# Patient Record
Sex: Female | Born: 1995 | Race: White | Hispanic: No | Marital: Single | Smoking: Never smoker
Health system: Southern US, Community
[De-identification: ages and names within clinical notes are randomized; demographics above are authoritative.]

## PROBLEM LIST (undated history)

## (undated) ENCOUNTER — Inpatient Hospital Stay (HOSPITAL_COMMUNITY): Payer: Self-pay

## (undated) DIAGNOSIS — O99019 Anemia complicating pregnancy, unspecified trimester: Secondary | ICD-10-CM

## (undated) DIAGNOSIS — N764 Abscess of vulva: Secondary | ICD-10-CM

## (undated) DIAGNOSIS — Z789 Other specified health status: Secondary | ICD-10-CM

## (undated) HISTORY — DX: Anemia complicating pregnancy, unspecified trimester: O99.019

## (undated) HISTORY — PX: APPENDECTOMY: SHX54

## (undated) HISTORY — PX: WISDOM TOOTH EXTRACTION: SHX21

## (undated) HISTORY — DX: Abscess of vulva: N76.4

---
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
## 2015-05-16 ENCOUNTER — Encounter: Payer: Self-pay | Admitting: Emergency Medicine

## 2015-05-16 ENCOUNTER — Emergency Department (INDEPENDENT_AMBULATORY_CARE_PROVIDER_SITE_OTHER)
Admission: EM | Admit: 2015-05-16 | Discharge: 2015-05-16 | Disposition: A | Discharge status: Home / Self Care | Payer: BC Managed Care – PPO | Source: Home / Self Care | Attending: Emergency Medicine | Admitting: Emergency Medicine

## 2015-05-16 DIAGNOSIS — A09 Infectious gastroenteritis and colitis, unspecified: Secondary | ICD-10-CM

## 2015-05-16 DIAGNOSIS — R197 Diarrhea, unspecified: Secondary | ICD-10-CM

## 2015-05-16 MED ORDER — CIPROFLOXACIN HCL 500 MG PO TABS: 500.0000 mg | ORAL_TABLET | Freq: Two times a day (BID) | ORAL | Status: DC

## 2015-05-16 NOTE — ED Notes (Signed)
Patient reports feeling ill for 3 days; diarrhea stools x 3 today; cough, nausea, headache and sore throat. No OTC today; no documented fever.

## 2015-05-16 NOTE — ED Provider Notes (Signed)
CSN: [id number]     Arrival date & time 05/16/15  1345 History   First MD Initiated Contact with Patient 05/16/15 1353     Chief Complaint  Patient presents with  . Diarrhea            Patient is a 19 y.o. female presenting with diarrhea, cough, headaches, and pharyngitis. The history is provided by the patient.  Diarrhea Diarrhea characteristics: Watery at times, semisolid other times. Severity:  Moderate Onset quality: Suddenly, 3 days ago after eating at a cookout fast food. Number of episodes:  About 6 times, 3 times today Timing:  Intermittent Progression:  Worsening Relieved by: Imodium helps a little. Exacerbated by: Drinking milk. Associated symptoms: fever and headaches   Associated symptoms: no vomiting   Associated symptoms comment:  No significant sore throat, but throat is scratchy. No significant cough, but rarely has nonproductive cough. No significant coryza. Risk factors: no recent antibiotic use, no sick contacts and no travel to endemic areas   Cough Associated symptoms: fever and headaches   Headache Associated symptoms: cough, diarrhea and fever   Associated symptoms: no vomiting   Sore Throat Associated symptoms include headaches.    Past Medical History  Diagnosis Date  . Other abscess of vulva   . Anemia, antepartum(648.23)   . SVD (spontaneous vaginal delivery) 08/05/2013   History reviewed. No pertinent past surgical history. Family History  Problem Relation Age of Onset  . Adopted: Yes   Social History  Substance Use Topics  . Smoking status: Never Smoker   . Smokeless tobacco: None  . Alcohol Use: No   OB History    Gravida Para Term Preterm AB TAB SAB Ectopic Multiple Living   Review of Systems  Constitutional: Positive for fever.  Respiratory: Positive for cough.   Gastrointestinal: Positive for diarrhea. Negative for vomiting.  Neurological: Positive for headaches.  All other systems reviewed and are  negative.  she denies chance of pregnancy. LMP 04/19/15 See hpi Allergies  Review of patient's allergies indicates no known allergies.  Home Medications   Prior to Admission medications   Medication Sig Start Date End Date Taking? Authorizing Provider  cetirizine (ZYRTEC) 10 MG tablet Take 10 mg by mouth daily.    Historical Provider, MD  ciprofloxacin (CIPRO) 500 MG tablet Take 1 tablet (500 mg total) by mouth 2 (two) times daily. For 5 days 05/16/15   Lajean Manes, MD  ibuprofen (ADVIL,MOTRIN) 400 MG tablet Take 1 tablet (400 mg total) by mouth every 6 (six) hours as needed. 12/27/13   Fayrene Helper, PA-C  LO LOESTRIN FE 1 MG-10 MCG / 10 MCG tablet  12/11/13   Historical Provider, MD   Meds Ordered and Administered this Visit  Medications - No data to display  BP 112/77 mmHg  Pulse 82  Temp(Src) 98.1 F (36.7 C) (Oral)  Resp 16  Ht  (1.499 m)  Wt 129 lb (58.514 kg)  BMI 26.04 kg/m2  SpO2 98%  LMP 04/19/2015 (Approximate) No data found.   Physical Exam  Constitutional: She is oriented to person, place, and time. She appears well-developed and well-nourished.  Non-toxic appearance. No distress.  Appears fatigued, but no acute distress. Pleasant, cooperative.  HENT:  Head: Normocephalic and atraumatic.  Nose: Nose normal.  Mouth/Throat: Oropharynx is clear and moist.  Oropharynx clear. Some moisture on mucous membranes. No lesions.  Eyes: Pupils are equal, round, and  reactive to light. No scleral icterus.  Neck: Normal range of motion. Neck supple. No JVD present.  Cardiovascular: Normal rate, regular rhythm and normal heart sounds.   No murmur heard. Pulmonary/Chest: Effort normal and breath sounds normal.  Abdominal: Soft. She exhibits no distension, no abdominal bruit and no mass. Bowel sounds are increased. There is no hepatosplenomegaly. There is tenderness (Minimal diffuse tenderness). There is no rebound, no guarding and no CVA tenderness.  Musculoskeletal: Normal  range of motion. She exhibits no edema or tenderness.  Lymphadenopathy:    She has no cervical adenopathy.  Neurological: She is alert and oriented to person, place, and time.  Skin: No rash noted.  Psychiatric: She has a normal mood and affect.  Nursing note and vitals reviewed.  neurologic exam intact. No focal deficits.  ED Course  Procedures (including critical care time)  Labs Review Labs Reviewed - No data to display  MDM   1. Diarrhea, infectious, adult   2. Diarrhea   Treatment options discussed, as well as risks, benefits, alternatives. Patient voiced understanding and agreement with the following plans: New Prescriptions   CIPROFLOXACIN (CIPRO) 500 MG TABLET    Take 1 tablet (500 mg total) by mouth 2 (two) times daily. For 5 days   Push fluids and other symptomatic care discussed May judiciously use Imodium, but precautions discussed Dietary measures discussed and other symptomatic care Follow-up with your primary care doctor in 2-3 days if not improving, or sooner if symptoms become worse. Precautions discussed. Red flags discussed. Questions invited and answered. Patient voiced understanding and agreement.     Lajean Manes, MD 05/16/15 [phone]

## 2015-10-22 ENCOUNTER — Ambulatory Visit (INDEPENDENT_AMBULATORY_CARE_PROVIDER_SITE_OTHER): Payer: BC Managed Care – PPO | Admitting: Family Medicine

## 2015-10-22 VITALS — BP 112/76 | HR 78 | Temp 98.5°F | Resp 20 | Ht 59.0 in | Wt 130.2 lb

## 2015-10-22 DIAGNOSIS — R6889 Other general symptoms and signs: Secondary | ICD-10-CM | POA: Diagnosis not present

## 2015-10-22 DIAGNOSIS — J101 Influenza due to other identified influenza virus with other respiratory manifestations: Secondary | ICD-10-CM

## 2015-10-22 LAB — POCT INFLUENZA A/B
INFLUENZA B, POC: NEGATIVE
Influenza A, POC: POSITIVE — AB

## 2015-10-22 MED ORDER — OSELTAMIVIR PHOSPHATE 75 MG PO CAPS
75.0000 mg | ORAL_CAPSULE | Freq: Two times a day (BID) | ORAL | Status: DC
Start: 2015-10-22 — End: 2017-01-10

## 2015-10-22 NOTE — Progress Notes (Signed)
Patient ID: Diana Farrell, female    DOB: [birth year]  Age: 20 y.o. MRN: [id number]  Chief Complaint  Patient presents with  . Fatigue    x 3 days  . Nausea  . Sore Throat  . Cough  . Nasal Congestion    Subjective:   20 year old young lady who works at a [employer], has a 65-year-old child. Her child recently had the flu, just getting over it. The patient is been sick since yesterday. She has just felt bad, achy and tired, hot and cold but no documented fevers. She has head congestion and cough and some sore throat.  Current allergies, medications, problem list, past/family and social histories reviewed.  Objective:  BP 112/76 mmHg  Pulse 78  Temp(Src) 98.5 F (36.9 C) (Oral)  Resp 20  Ht  (1.499 m)  Wt 130 lb 3.2 oz (59.058 kg)  BMI 26.28 kg/m2  SpO2 93%  LMP 09/04/2015  She did not have a flu shot. Her TMs are normal. Nose congested. Throat minimal erythema. Neck supple without significant nodes. Chest is clear to auscultation. Heart regular without murmur.  Assessment & Plan:   Assessment: 1. Flu-like symptoms   2. Influenza A       Plan: Rapid flu test  Results for orders placed or performed in visit on 10/22/15  POCT Influenza A/B  Result Value Ref Range   Influenza A, POC Positive (A) Negative   Influenza B, POC Negative Negative     Orders Placed This Encounter  Procedures  . POCT Influenza A/B    No orders of the defined types were placed in this encounter.         Patient Instructions  Drink plenty of fluids and get enough rest  Take Tylenol 500 mg 2 tablets 3 times daily and/or ibuprofen 200 mg 3 tablets 3 times daily for fever or aching  Take Tamiflu 75 mg one twice daily for 5 days for the influenza  Stay out of work until you are feeling better and fever free for 24 hours. We'll probably be Monday or Tuesday     Return if symptoms worsen or fail to improve.   Jade Burkard, MD 10/22/2015

## 2015-10-22 NOTE — Patient Instructions (Signed)
Drink plenty of fluids and get enough rest  Take Tylenol 500 mg 2 tablets 3 times daily and/or ibuprofen 200 mg 3 tablets 3 times daily for fever or aching  Take Tamiflu 75 mg one twice daily for 5 days for the influenza  Stay out of work until you are feeling better and fever free for 24 hours. We'll probably be Monday or Tuesday

## 2015-10-22 NOTE — Addendum Note (Signed)
Addended by: Felix Ahmadi A on: 10/22/2015 11:29 AM   Modules accepted: Kipp Brood

## 2017-01-10 ENCOUNTER — Ambulatory Visit (HOSPITAL_COMMUNITY)
Admission: EM | Admit: 2017-01-10 | Discharge: 2017-01-10 | Disposition: A | Discharge status: Home / Self Care | Payer: BC Managed Care – PPO | Attending: Internal Medicine | Admitting: Internal Medicine

## 2017-01-10 ENCOUNTER — Encounter (HOSPITAL_COMMUNITY): Payer: Self-pay | Admitting: Emergency Medicine

## 2017-01-10 DIAGNOSIS — R0982 Postnasal drip: Secondary | ICD-10-CM

## 2017-01-10 DIAGNOSIS — J301 Allergic rhinitis due to pollen: Secondary | ICD-10-CM

## 2017-01-10 DIAGNOSIS — J040 Acute laryngitis: Secondary | ICD-10-CM | POA: Diagnosis not present

## 2017-01-10 MED ORDER — PREDNISONE 20 MG PO TABS: ORAL_TABLET | ORAL | 0 refills | Status: DC

## 2017-01-10 NOTE — ED Notes (Signed)
Patient refused strep screening.  Cannot and will not tolerate throat swabbing

## 2017-01-10 NOTE — Discharge Instructions (Signed)
Your allergies are causing some very annoying symptoms. You have a large amount of thick drainage in the back of your throat coming from your sinuses. When all of the strains on the back of the throat irritation causes it to be sore and when it dripped some lower to the voice box causes it to become inflamed in you develop laryngitis and lose your voice. Achy medications can help. Some of the following medications are suggested for congestion, and drainage. Also anemic he may use Cepacol lozenges to help a sore throat pain and drink plenty of water especially before bedtime and upon awakening in the morning. Sudafed PE 10 mg every 4 to 6 hours as needed for congestion Allegra or Zyrtec daily as needed for drainage and runny nose. For stronger antihistamine may take Chlor-Trimeton 2 to 4 mg every 4 to 6 hours, may cause drowsiness. Saline nasal spray used frequently. Ibuprofen 600 mg every 6 hours as needed for pain, discomfort or fever. Drink plenty of fluids and stay well-hydrated. Flonase or Rhinocort nasal spray daily

## 2017-01-10 NOTE — ED Provider Notes (Signed)
CSN: [id number]     Arrival date & time 01/10/17  1001 History   First MD Initiated Contact with Patient 01/10/17 1027     Chief Complaint  Patient presents with  . Sore Throat   (Consider location/radiation/quality/duration/timing/severity/associated sxs/prior Treatment) 21 year old female presents with sore throat, laryngitis, PND, runny nose for 2 weeks. Denies fever but once had chills and felt cold. Current temperature 98.2. She states that her child was around another child and was diagnosed with strep throat. She had presumed that cause of that contacted she had strep throat. The symptoms have been occurring for over 2 weeks.      Past Medical History:  Diagnosis Date  . Anemia, antepartum(648.23)   . Other abscess of vulva   . SVD (spontaneous vaginal delivery) 08/05/2013   History reviewed. No pertinent surgical history. Family History  Problem Relation Age of Onset  . Adopted: Yes   Social History  Substance Use Topics  . Smoking status: Never Smoker  . Smokeless tobacco: Not on file  . Alcohol use No   OB History    Gravida Para Term Preterm AB Living   1 1 1     1    SAB TAB Ectopic Multiple Live Births           1     Review of Systems  Constitutional: Positive for chills. Negative for activity change, appetite change, fatigue and fever.  HENT: Positive for congestion, postnasal drip, rhinorrhea and sore throat. Negative for facial swelling.   Eyes: Negative.   Respiratory: Positive for cough.   Cardiovascular: Negative.   Musculoskeletal: Negative for neck pain and neck stiffness.  Skin: Negative for pallor and rash.  Neurological: Negative.   All other systems reviewed and are negative.   Allergies  Patient has no known allergies.  Home Medications   Prior to Admission medications   Medication Sig Start Date End Date Taking? Authorizing Provider  cetirizine (ZYRTEC) 10 MG tablet Take 10 mg by mouth daily. Reported on 10/22/2015    [provider]  LO LOESTRIN FE 1 MG-10 MCG / 10 MCG tablet  12/11/13   [provider]  predniSONE (DELTASONE) 20 MG tablet Take 3 tabs po on first day, 2 tabs second day, 2 tabs third day, 1 tab fourth day, 1 tab 5th day. Take with food. 01/10/17   Hayden RasmussenMabe, Loukas Antonson, NP   Meds Ordered and Administered this Visit  Medications - No data to display  BP (!) 92/57 (BP Location: Left Arm)   Pulse 65   Temp 98.2 F (36.8 C) (Oral)   Resp 16   SpO2 99%  No data found.   Physical Exam  Constitutional: She is oriented to person, place, and time. She appears well-developed and well-nourished. No distress.  HENT:  Mouth/Throat: No oropharyngeal exudate.  Bilateral TMs are clear. Posterior pharynx with minor erythema and thick discharge. No exudates or swelling.  Eyes: EOM are normal.  Neck: Normal range of motion. Neck supple.  Cardiovascular: Normal rate and regular rhythm.   Pulmonary/Chest: Effort normal and breath sounds normal. No respiratory distress.  Musculoskeletal: Normal range of motion. She exhibits no edema.  Lymphadenopathy:    She has no cervical adenopathy.  Neurological: She is alert and oriented to person, place, and time.  Skin: Skin is warm and dry. No rash noted.  Psychiatric: She has a normal mood and affect.  Nursing note and vitals reviewed.   Urgent Care Course     Procedures (  including critical care time)  Labs Review Labs Reviewed - No data to display  Imaging Review No results found.   Visual Acuity Review  Right Eye Distance:   Left Eye Distance:   Bilateral Distance:    Right Eye Near:   Left Eye Near:    Bilateral Near:         MDM   1. Seasonal allergic rhinitis due to pollen   2. PND (post-nasal drip)   3. Laryngitis, acute    Your allergies are causing some very annoying symptoms. You have a large amount of thick drainage in the back of your throat coming from your sinuses. When all of the strains on the back of the throat  irritation causes it to be sore and when it dripped some lower to the voice box causes it to become inflamed in you develop laryngitis and lose your voice. Achy medications can help. Some of the following medications are suggested for congestion, and drainage. Also anemic he may use Cepacol lozenges to help a sore throat pain and drink plenty of water especially before bedtime and upon awakening in the morning. Sudafed PE 10 mg every 4 to 6 hours as needed for congestion Allegra or Zyrtec daily as needed for drainage and runny nose. For stronger antihistamine may take Chlor-Trimeton 2 to 4 mg every 4 to 6 hours, may cause drowsiness. Saline nasal spray used frequently. Ibuprofen 600 mg every 6 hours as needed for pain, discomfort or fever. Drink plenty of fluids and stay well-hydrated. Flonase or Rhinocort nasal spray daily    Hayden Rasmussen, NP 01/10/17 1048

## 2017-01-10 NOTE — ED Triage Notes (Signed)
Sore throat and loss of voice, concerned for strep.  Unknown fever, but has had chills

## 2017-02-06 ENCOUNTER — Ambulatory Visit (HOSPITAL_COMMUNITY)
Admission: EM | Admit: 2017-02-06 | Discharge: 2017-02-06 | Disposition: A | Discharge status: Other Acute Inpatient Hospital | Payer: BC Managed Care – PPO

## 2017-04-10 ENCOUNTER — Ambulatory Visit: Payer: BC Managed Care – PPO | Admitting: Family Medicine

## 2017-04-10 NOTE — Progress Notes (Deleted)
  No chief complaint on file.   HPI  Past Medical History:  Diagnosis Date  . Anemia, antepartum(648.23)   . Other abscess of vulva   . SVD (spontaneous vaginal delivery) 08/05/2013    Current Outpatient Prescriptions  Medication Sig Dispense Refill  . cetirizine (ZYRTEC) 10 MG tablet Take 10 mg by mouth daily. Reported on 10/22/2015    . LO LOESTRIN FE 1 MG-10 MCG / 10 MCG tablet     . predniSONE (DELTASONE) 20 MG tablet Take 3 tabs po on first day, 2 tabs second day, 2 tabs third day, 1 tab fourth day, 1 tab 5th day. Take with food. 9 tablet 0   No current facility-administered medications for this visit.     Allergies: No Known Allergies  No past surgical history on file.  Social History   Social History  . Marital status: Single    Spouse name: N/A  . Number of children: N/A  . Years of education: N/A   Social History Main Topics  . Smoking status: Never Smoker  . Smokeless tobacco: Not on file  . Alcohol use No  . Drug use: No  . Sexual activity: Not Currently   Other Topics Concern  . Not on file   Social History Narrative  . No narrative on file    ROS  Objective: There were no vitals filed for this visit.  Physical Exam  Assessment and Plan There are no diagnoses linked to this encounter.   Anh Mangano P [employer] Corporation[employer]

## 2017-04-17 ENCOUNTER — Encounter: Payer: Self-pay | Admitting: Family Medicine

## 2017-04-17 ENCOUNTER — Ambulatory Visit (INDEPENDENT_AMBULATORY_CARE_PROVIDER_SITE_OTHER): Payer: BC Managed Care – PPO | Admitting: Family Medicine

## 2017-04-17 VITALS — BP 120/76 | HR 71 | Temp 97.2°F | Resp 18 | Ht 59.0 in | Wt 135.4 lb

## 2017-04-17 DIAGNOSIS — Z113 Encounter for screening for infections with a predominantly sexual mode of transmission: Secondary | ICD-10-CM

## 2017-04-17 DIAGNOSIS — Z3200 Encounter for pregnancy test, result unknown: Secondary | ICD-10-CM

## 2017-04-17 DIAGNOSIS — Z124 Encounter for screening for malignant neoplasm of cervix: Secondary | ICD-10-CM

## 2017-04-17 DIAGNOSIS — Z30011 Encounter for initial prescription of contraceptive pills: Secondary | ICD-10-CM | POA: Diagnosis not present

## 2017-04-17 LAB — POCT URINE PREGNANCY: Preg Test, Ur: NEGATIVE

## 2017-04-17 MED ORDER — NORGESTIMATE-ETH ESTRADIOL 0.25-35 MG-MCG PO TABS: 1.0000 | ORAL_TABLET | Freq: Every day | ORAL | 11 refills | Status: DC

## 2017-04-17 NOTE — Patient Instructions (Signed)
     IF you received an x-ray today, you will receive an invoice from [facility]. Please contact [facility] at [phone] with questions or concerns regarding your invoice.   IF you received labwork today, you will receive an invoice from [facility]. Please contact [facility] at [phone] with questions or concerns regarding your invoice.   Our billing staff will not be able to assist you with questions regarding bills from these companies.  You will be contacted with the lab results as soon as they are available. The fastest way to get your results is to activate your My Chart account. Instructions are located on the last page of this paperwork. If you have not heard from us regarding the results in 2 weeks, please contact this office.     

## 2017-04-18 LAB — PAP LB, CT-NG, RFX HPV ASCU
Chlamydia, Nuc. Acid Amp: NEGATIVE
Gonococcus, Nuc. Acid Amp: NEGATIVE
PAP Smear Comment: 0

## 2017-04-18 LAB — HIV ANTIBODY (ROUTINE TESTING W REFLEX): HIV Screen 4th Generation wRfx: NONREACTIVE

## 2017-04-18 LAB — HEPATITIS C ANTIBODY: Hep C Virus Ab: <0.1 s/co ratio (ref 0.0–0.9)

## 2017-04-19 ENCOUNTER — Encounter: Payer: Self-pay | Admitting: Family Medicine

## 2017-04-19 LAB — GC/CHLAMYDIA PROBE AMP
Chlamydia trachomatis, NAA: NEGATIVE
Neisseria gonorrhoeae by PCR: NEGATIVE

## 2017-04-19 NOTE — Progress Notes (Signed)
8/16/20188:17 AM  Diana LudwigBrighita Farrell 1996-07-18, 21 y.o. female [id number]  Chief Complaint  Patient presents with  . Possible Pregnancy  . Std check  . Cyst    Vaginal area  . Medication Refill    Birth control    HPI:   Patient is a 21 y.o. female who presents today with several concerns as above. Would like to have a pregancy test, STD check and restart BC.  LMP unsure. [facility], stopped it about 2 months ago because she broke up wither her boyfriend, he was an IVDU. now would like to restart.  Was having some nausea with previous BC. Would like to continue with OCPs, she reports does well with them. She is G1P1, her son is ~ 2yo. Last pap done during her pregnancy, denies abnormal.  Does not use condoms consistently. She also has a recurring small cyst on right outer labia, states that she has been told it is not genital warts or herpes, does not have it today.  [facility] screen [facility] 04/17/2017 10/22/2015  Decreased Interest 0 0  Down, Depressed, Hopeless 0 0  PHQ - 2 Score 0 0    No Known Allergies  No current outpatient prescriptions on file prior to visit.   No current facility-administered medications on file prior to visit.     Past Medical History:  Diagnosis Date  . Anemia, antepartum(648.23)   . Other abscess of vulva   . SVD (spontaneous vaginal delivery) 08/05/2013    No past surgical history on file.  Social History  Substance Use Topics  . Smoking status: Never Smoker  . Smokeless tobacco: Never Used  . Alcohol use No    Family History  Problem Relation Age of Onset  . Adopted: Yes    Review of Systems  Constitutional: Negative for chills and fever.  Respiratory: Negative for cough and shortness of breath.   Cardiovascular: Negative for chest pain, palpitations and leg swelling.  Gastrointestinal: Negative for abdominal pain, nausea and vomiting.    OBJECTIVE:  Vitals:   04/17/17 1611  Weight: 135 lb 6.4 oz (61.4 kg)    Height: 4\' 11"  (1.499 m)    Physical Exam  Constitutional: She is oriented to person, place, and time and well-developed, well-nourished, and in no distress.  HENT:  Head: Normocephalic and atraumatic.  Eyes: Pupils are equal, round, and reactive to light. EOM are normal.  Neck: Neck supple.  Cardiovascular: Normal rate, regular rhythm and normal heart sounds.   No murmur heard. Pulmonary/Chest: Effort normal and breath sounds normal.  Genitourinary: Vagina normal and cervix normal. No vaginal discharge found.  Neurological: She is alert and oriented to person, place, and time. Gait normal.  Skin: Skin is warm and dry.    Results for orders placed or performed in visit on 04/17/17  HIV antibody (with reflex)  Result Value Ref Range   HIV Screen 4th Generation wRfx Non Reactive Non Reactive  Hepatitis C antibody  Result Value Ref Range   Hep C Virus Ab <0.1 0.0 - 0.9 s/co ratio  POCT urine pregnancy  Result Value Ref Range   Preg Test, Ur Negative Negative  Pap Lb, Ct-Ng, rfx HPV ASCU  Result Value Ref Range   DIAGNOSIS: Comment    Specimen adequacy: Comment    Clinician Provided ICD10 Comment    Performed by: Comment    PAP Smear Comment .    Note: Comment    PAP Reflex Comment    Chlamydia,  Nuc. Acid Amp Negative Negative   Gonococcus, Nuc. Acid Amp Negative Negative     ASSESSMENT and PLAN:  Problem List Items Addressed This Visit    None    Visit Diagnoses    Encounter for Papanicolaou smear for cervical cancer screening    -  Primary   Relevant Orders   Pap Lb, Ct-Ng, rfx HPV ASCU (Completed)   Pregnancy examination or test, pregnancy unconfirmed       Relevant Orders   POCT urine pregnancy (Completed)   Screen for STD (sexually transmitted disease)       Relevant Orders   HIV antibody (with reflex) (Completed)   GC/Chlamydia Probe Amp   Hepatitis C antibody   Encounter for initial prescription of contraceptive pills       Relevant Medications    norgestimate-ethinyl estradiol (ORTHO-CYCLEN,SPRINTEC,PREVIFEM) 0.25-35 MG-MCG tablet     1. Pregnancy test negative, Patient instructed on quick start method for [facility]. Routine precautions given. 2.Testing for STDs and Hep C given h/o partner's IVDU.  Discussed importance of condom use for STD prevention.   3. Routine Pap done today, repeat in 2 years if normal.   Meds ordered this encounter  Medications  . norgestimate-ethinyl estradiol (ORTHO-CYCLEN,SPRINTEC,PREVIFEM) 0.25-35 MG-MCG tablet    Sig: Take 1 tablet by mouth daily.    Dispense:  1 Package    Refill:  48       Diana Lipps, MD Primary Care at [facility] [address] [place], [place] [address] Ph.  [phone] Fax [phone]

## 2017-10-19 ENCOUNTER — Encounter (HOSPITAL_COMMUNITY): Payer: Self-pay

## 2017-10-19 ENCOUNTER — Inpatient Hospital Stay (HOSPITAL_COMMUNITY): Payer: BC Managed Care – PPO

## 2017-10-19 ENCOUNTER — Inpatient Hospital Stay (HOSPITAL_COMMUNITY)
Admission: AD | Admit: 2017-10-19 | Discharge: 2017-10-19 | Disposition: A | Discharge status: Home / Self Care | Payer: BC Managed Care – PPO | Source: Ambulatory Visit | Attending: Obstetrics & Gynecology | Admitting: Obstetrics & Gynecology

## 2017-10-19 ENCOUNTER — Other Ambulatory Visit: Payer: Self-pay

## 2017-10-19 DIAGNOSIS — O3680X0 Pregnancy with inconclusive fetal viability, not applicable or unspecified: Secondary | ICD-10-CM

## 2017-10-19 DIAGNOSIS — O26899 Other specified pregnancy related conditions, unspecified trimester: Secondary | ICD-10-CM | POA: Diagnosis not present

## 2017-10-19 HISTORY — DX: Other specified health status: Z78.9

## 2017-10-19 LAB — WET PREP, GENITAL
Clue Cells Wet Prep HPF POC: NONE SEEN
Sperm: NONE SEEN
Trich, Wet Prep: NONE SEEN
Yeast Wet Prep HPF POC: NONE SEEN

## 2017-10-19 LAB — CBC
HCT: 39.9 % (ref 36.0–46.0)
Hemoglobin: 14 g/dL (ref 12.0–15.0)
MCH: 31.5 pg (ref 26.0–34.0)
MCHC: 35.1 g/dL (ref 30.0–36.0)
MCV: 89.9 fL (ref 78.0–100.0)
Platelets: 337 10*3/uL (ref 150–400)
RBC: 4.44 MIL/uL (ref 3.87–5.11)
RDW: 12.6 % (ref 11.5–15.5)
WBC: 14 10*3/uL — ABNORMAL HIGH (ref 4.0–10.5)

## 2017-10-19 LAB — URINALYSIS, ROUTINE W REFLEX MICROSCOPIC
BILIRUBIN URINE: NEGATIVE
GLUCOSE, UA: NEGATIVE mg/dL
Hgb urine dipstick: NEGATIVE
KETONES UR: NEGATIVE mg/dL
Leukocytes, UA: NEGATIVE
Nitrite: NEGATIVE
Protein, ur: NEGATIVE mg/dL
Specific Gravity, Urine: 1.017 (ref 1.005–1.030)
pH: 5 (ref 5.0–8.0)

## 2017-10-19 LAB — POCT PREGNANCY, URINE: Preg Test, Ur: POSITIVE — AB

## 2017-10-19 LAB — HCG, QUANTITATIVE, PREGNANCY: hCG, Beta Chain, Quant, S: 1551 m[IU]/mL — ABNORMAL HIGH (ref <5)

## 2017-10-19 NOTE — MAU Note (Signed)
[facility] and they were unable to see pregnancy. Pt has had  +HPT and was confirmed last week at [facility]gent [facility]. Pt denies VB and discharge.

## 2017-10-19 NOTE — Discharge Instructions (Signed)
Miscarriage A miscarriage is the loss of an unborn baby (fetus) before the 20th week of pregnancy. The cause is often unknown. Follow these instructions at home:  You may need to stay in bed (bed rest), or you may be able to do light activity. Go about activity as told by your doctor.  Have help at home.  Write down how many pads you use each day. Write down how soaked they are.  Do not use tampons. Do not wash out your vagina (douche) or have sex (intercourse) until your doctor approves.  Only take medicine as told by your doctor.  Do not take aspirin.  Keep all doctor visits as told.  If you or your partner have problems with grieving, talk to your doctor. You can also try counseling. Give yourself time to grieve before trying to get pregnant again. Get help right away if:  You have bad cramps or pain in your back or belly (abdomen).  You have a fever.  You pass large clumps of blood (clots) from your vagina that are walnut-sized or larger. Save the clumps for your doctor to see.  You pass large amounts of tissue from your vagina. Save the tissue for your doctor to see.  You have more bleeding.  You have thick, bad-smelling fluid (discharge) coming from the vagina.  You get lightheaded, weak, or you pass out (faint).  You have chills. This information is not intended to replace advice given to you by your health care provider. Make sure you discuss any questions you have with your health care provider. Document Released: 11/13/2011 Document Revised: 01/27/2016 Document Reviewed: 09/21/2011 Elsevier Interactive Patient Education  2017 Elsevier Inc. Ectopic Pregnancy An ectopic pregnancy happens when a fertilized egg grows outside the uterus. A pregnancy cannot live outside of the uterus. This problem often happens in the fallopian tube. It is often caused by damage to the fallopian tube. If this problem is found early, you may be treated with medicine. If your tube tears or  bursts open (ruptures), you will bleed inside. This is an emergency. You will need surgery. Get help right away. What are the signs or symptoms? You may have normal pregnancy symptoms at first. These include:  Missing your period.  Feeling sick to your stomach (nauseous).  Being tired.  Having tender breasts.  Then, you may start to have symptoms that are not normal. These include:  Pain with sex (intercourse).  Bleeding from the vagina. This includes light bleeding (spotting).  Belly (abdomen) or lower belly cramping or pain. This may be felt on one side.  A fast heartbeat (pulse).  Passing out (fainting) after going poop (bowel movement).  If your tube tears, you may have symptoms such as:  Really bad pain in the belly or lower belly. This happens suddenly.  Dizziness.  Passing out.  Shoulder pain.  Get help right away if: You have any of these symptoms. This is an emergency. This information is not intended to replace advice given to you by your health care provider. Make sure you discuss any questions you have with your health care provider. Document Released: 11/17/2008 Document Revised: 01/27/2016 Document Reviewed: 04/02/2013 Elsevier Interactive Patient Education  2017 [employer]

## 2017-10-19 NOTE — MAU Provider Note (Signed)
History     CSN: [id number]  Arrival date and time: 10/19/17 1408   First Provider Initiated Contact with Patient 10/19/17 1436     G2P1001 @[redacted]w[redacted]d  by sure LMP sent from [facility] after they were unable to locate pregnancy by [place] today. Pt denies VB or pain. She was planning TOP.    OB History    Gravida Para Term Preterm AB Living   2 1 1     1    SAB TAB Ectopic Multiple Live Births           1      Past Medical History:  Diagnosis Date  . Anemia, antepartum(648.23)   . Medical history non-contributory   . Other abscess of vulva   . SVD (spontaneous vaginal delivery) 08/05/2013    Past Surgical History:  Procedure Laterality Date  . WISDOM TOOTH EXTRACTION      Family History  Adopted: Yes  Family history unknown: Yes    Social History   Tobacco Use  . Smoking status: Never Smoker  . Smokeless tobacco: Never Used  Substance Use Topics  . Alcohol use: No  . Drug use: No    Allergies: No Known Allergies  Medications Prior to Admission  Medication Sig Dispense Refill Last Dose  . norgestimate-ethinyl estradiol (ORTHO-CYCLEN,SPRINTEC,PREVIFEM) 0.25-35 MG-MCG tablet Take 1 tablet by mouth daily. (Patient not taking: Reported on 10/19/2017) 1 Package 11 Not Taking at Unknown time    Review of Systems  Gastrointestinal: Positive for nausea. Negative for abdominal pain and vomiting.  Genitourinary: Negative for vaginal bleeding and vaginal discharge.   Physical Exam   Blood pressure 111/75, pulse 73, temperature 98 F (36.7 C), temperature source Oral, resp. rate 18, height 4\' 11"  (1.499 m), weight 142 lb (64.4 kg), last menstrual period 08/20/2017, unknown if currently breastfeeding.  Physical Exam  Nursing note and vitals reviewed. Constitutional: She is oriented to person, place, and time. She appears well-developed and well-nourished. No distress.  HENT:  Head: Normocephalic and atraumatic.  Neck: Normal range of motion.  Cardiovascular: Normal  rate.  Respiratory: Effort normal. No respiratory distress.  GI: Soft. She exhibits no distension and no mass. There is no tenderness. There is no rebound and no guarding.  Genitourinary:  Genitourinary Comments: External: no lesions or erythema Vagina: rugated, pink, moist, small white curdy discharge Uterus: non enlarged, anteverted, non tender, no CMT Adnexae: no masses, no tenderness left, no tenderness right   Musculoskeletal: Normal range of motion.  Neurological: She is alert and oriented to person, place, and time.  Skin: Skin is warm and dry.  Psychiatric: She has a normal mood and affect.   Results for orders placed or performed during the hospital encounter of 10/19/17 (from the past 24 hour(s))  Urinalysis, Routine w reflex microscopic     Status: None   Collection Time: 10/19/17  2:17 PM  Result Value Ref Range   Color, Urine YELLOW YELLOW   APPearance CLEAR CLEAR   Specific Gravity, Urine 1.017 1.005 - 1.030   pH 5.0 5.0 - 8.0   Glucose, UA NEGATIVE NEGATIVE mg/dL   Hgb urine dipstick NEGATIVE NEGATIVE   Bilirubin Urine NEGATIVE NEGATIVE   Ketones, ur NEGATIVE NEGATIVE mg/dL   Protein, ur NEGATIVE NEGATIVE mg/dL   Nitrite NEGATIVE NEGATIVE   Leukocytes, UA NEGATIVE NEGATIVE  Pregnancy, urine POC     Status: Abnormal   Collection Time: 10/19/17  2:25 PM  Result Value Ref Range   Preg Test, Ur POSITIVE (A)  NEGATIVE  Wet prep, genital     Status: Abnormal   Collection Time: 10/19/17  2:45 PM  Result Value Ref Range   Yeast Wet Prep HPF POC NONE SEEN NONE SEEN   Trich, Wet Prep NONE SEEN NONE SEEN   Clue Cells Wet Prep HPF POC NONE SEEN NONE SEEN   WBC, Wet Prep HPF POC FEW (A) NONE SEEN   Sperm NONE SEEN   CBC     Status: Abnormal   Collection Time: 10/19/17  2:54 PM  Result Value Ref Range   WBC 14.0 (H) 4.0 - 10.5 K/uL   RBC 4.44 3.87 - 5.11 MIL/uL   Hemoglobin 14.0 12.0 - 15.0 g/dL   HCT 28.439.9 13.236.0 - 44.046.0 %   MCV 89.9 78.0 - 100.0 fL   MCH 31.5 26.0 -  34.0 pg   MCHC 35.1 30.0 - 36.0 g/dL   RDW 10.212.6 72.511.5 - 36.615.5 %   Platelets 337 150 - 400 K/uL  hCG, quantitative, pregnancy     Status: Abnormal   Collection Time: 10/19/17  2:54 PM  Result Value Ref Range   hCG, Beta Chain, Quant, S 1,551 (H) <5 mIU/mL  [place] Ob Less Than 14 Weeks With Ob Transvaginal  Result Date: 10/19/2017 CLINICAL DATA:  Elevated B-HCG EXAM: OBSTETRIC <14 WK US AND TRANSVAGINAL OB US TECHNIQUE: Both transabdominal and transvaginal ultrasound examinations were performed for complete evaluation of the gestation as well as the maternal uterus, adnexal regions, and pelvic cul-de-sac. Transvaginal technique was performed to assess early pregnancy. COMPARISON:  None. FINDINGS: Intrauterine gestational sac: None Yolk sac:  Not Visualized. Embryo:  Not Visualized. Cardiac Activity: Not Visualized. Subchorionic hemorrhage:  None visualized. Maternal uterus/adnexae: Normal bilateral ovaries. Right ovary measures 2.6 x 2.8 x 1.6 cm. Left ovary measures 3.6 x 3.5 x 2.7 cm. Small hypoechoic area in the left ovary with peripheral Doppler flow which may reflect a corpus luteum cyst. No pelvic free fluid. IMPRESSION: 1. No gestational sac or intrauterine pregnancy identified. Given the elevated beta HCG differential considerations include pregnancy too early to detect versus ectopic pregnancy versus missed abortion. Recommend clinical correlation, serial quantitative beta HCGs, ectopic precautions, and followup ultrasound as clinically indicated. Electronically Signed   By: Elige KoHetal  Patel   On: 10/19/2017 16:07   MAU Course  Procedures  MDM Labs and US ordered and reviewed. No IUP or adnexal mass seen on US, findings could indicate early pregnancy, ectopic pregnancy, or failed pregnancy-discussed with pt. Will follow quant in 48 hrs.Stable for discharge home.  Assessment and Plan   1. Pregnancy of unknown anatomic location    Discharge home Follow up in MAU on 10/21/17 for quant  HCG Ectopic/return precautions  Allergies as of 10/19/2017   No Known Allergies     Medication List    STOP taking these medications   norgestimate-ethinyl estradiol 0.25-35 MG-MCG tablet Commonly known as:  Candise CheORTHO-CYCLEN,SPRINTEC,PREVIFEM      Clessie Karras, CNM 10/19/2017, 4:28 PM

## 2017-10-21 ENCOUNTER — Inpatient Hospital Stay (HOSPITAL_COMMUNITY)
Admission: AD | Admit: 2017-10-21 | Discharge: 2017-10-21 | Disposition: A | Discharge status: Home / Self Care | Payer: BC Managed Care – PPO | Source: Ambulatory Visit | Attending: Family Medicine | Admitting: Family Medicine

## 2017-10-21 DIAGNOSIS — Z3490 Encounter for supervision of normal pregnancy, unspecified, unspecified trimester: Secondary | ICD-10-CM | POA: Insufficient documentation

## 2017-10-21 DIAGNOSIS — O3680X0 Pregnancy with inconclusive fetal viability, not applicable or unspecified: Secondary | ICD-10-CM

## 2017-10-21 LAB — HCG, QUANTITATIVE, PREGNANCY: hCG, Beta Chain, Quant, S: 3480 m[IU]/mL — ABNORMAL HIGH (ref <5)

## 2017-10-21 NOTE — MAU Provider Note (Signed)
Ms. Diana Farrell  is a 22 y.o. G2P1001  at 2336w6d who presents to MAU today for follow-up quant hCG. The patient denies abdominal pain, vaginal bleeding, N/V or fever.   BP 118/60 (BP Location: Right Arm)   Pulse 88   Resp 16   LMP 08/20/2017 (Exact Date)   GENERAL: Well-developed, well-nourished female in no acute distress.  HEENT: Normocephalic, atraumatic.   LUNGS: Effort normal HEART: Regular rate  SKIN: Warm, dry and without erythema PSYCH: Normal mood and affect  Component     Latest Ref Rng & Units 10/19/2017 10/21/2017  HCG, Beta Chain, Quant, S     <5 mIU/mL 1,551 (H) 3,480 (H)    A: Appropriate rise in quant hCG after 48 hours  P: Discharge home Bleeding/Ectopic precautions discussed Pt [facility]   Diana Farrell, Diana Pondexter, NP  10/21/2017 2:24 PM

## 2017-10-21 NOTE — Discharge Instructions (Signed)
Return to care  °· If you have heavier bleeding that soaks through more that 2 pads per hour for an hour or more °· If you bleed so much that you feel like you might pass out or you do pass out °· If you have significant abdominal pain that is not improved with Tylenol  °· If you develop a fever > 100.5 ° ° ° °First Trimester of Pregnancy °The first trimester of pregnancy is from week 1 until the end of week 13 (months 1 through 3). During this time, your baby will begin to develop inside you. At 6-8 weeks, the eyes and face are formed, and the heartbeat can be seen on ultrasound. At the end of 12 weeks, all the baby's organs are formed. Prenatal care is all the medical care you receive before the birth of your baby. Make sure you get good prenatal care and follow all of your doctor's instructions. °Follow these instructions at home: °Medicines °· Take over-the-counter and prescription medicines only as told by your doctor. Some medicines are safe and some medicines are not safe during pregnancy. °· Take a prenatal vitamin that contains at least 600 micrograms (mcg) of folic acid. °· If you have trouble pooping (constipation), take medicine that will make your stool soft (stool softener) if your doctor approves. °Eating and drinking °· Eat regular, healthy meals. °· Your doctor will tell you the amount of weight gain that is right for you. °· Avoid raw meat and uncooked cheese. °· If you feel sick to your stomach (nauseous) or throw up (vomit): °? Eat 4 or 5 small meals a day instead of 3 large meals. °? Try eating a few soda crackers. °? Drink liquids between meals instead of during meals. °· To prevent constipation: °? Eat foods that are high in fiber, like fresh fruits and vegetables, whole grains, and beans. °? Drink enough fluids to keep your pee (urine) clear or pale yellow. °Activity °· Exercise only as told by your doctor. Stop exercising if you have cramps or pain in your lower belly (abdomen) or low  back. °· Do not exercise if it is too hot, too humid, or if you are in a place of great height (high altitude). °· Try to avoid standing for long periods of time. Move your legs often if you must stand in one place for a long time. °· Avoid heavy lifting. °· Wear low-heeled shoes. Sit and stand up straight. °· You can have sex unless your doctor tells you not to. °Relieving pain and discomfort °· Wear a good support bra if your breasts are sore. °· Take warm water baths (sitz baths) to soothe pain or discomfort caused by hemorrhoids. Use hemorrhoid cream if your doctor says it is okay. °· Rest with your legs raised if you have leg cramps or low back pain. °· If you have puffy, bulging veins (varicose veins) in your legs: °? Wear support hose or compression stockings as told by your doctor. °? Raise (elevate) your feet for 15 minutes, 3-4 times a day. °? Limit salt in your food. °Prenatal care °· Schedule your prenatal visits by the twelfth week of pregnancy. °· Write down your questions. Take them to your prenatal visits. °· Keep all your prenatal visits as told by your doctor. This is important. °Safety °· Wear your seat belt at all times when driving. °· Make a list of emergency phone numbers. The list should include numbers for family, friends, the hospital, and police and   fire departments. °General instructions °· Ask your doctor for a referral to a local prenatal class. Begin classes no later than at the start of month 6 of your pregnancy. °· Ask for help if you need counseling or if you need help with nutrition. Your doctor can give you advice or tell you where to go for help. °· Do not use hot tubs, steam rooms, or saunas. °· Do not douche or use tampons or scented sanitary pads. °· Do not cross your legs for long periods of time. °· Avoid all herbs and alcohol. Avoid drugs that are not approved by your doctor. °· Do not use any tobacco products, including cigarettes, chewing tobacco, and electronic  cigarettes. If you need help quitting, ask your doctor. You may get counseling or other support to help you quit. °· Avoid cat litter boxes and soil used by cats. These carry germs that can cause birth defects in the baby and can cause a loss of your baby (miscarriage) or stillbirth. °· Visit your dentist. At home, brush your teeth with a soft toothbrush. Be gentle when you floss. °Contact a doctor if: °· You are dizzy. °· You have mild cramps or pressure in your lower belly. °· You have a nagging pain in your belly area. °· You continue to feel sick to your stomach, you throw up, or you have watery poop (diarrhea). °· You have a bad smelling fluid coming from your vagina. °· You have pain when you pee (urinate). °· You have increased puffiness (swelling) in your face, hands, legs, or ankles. °Get help right away if: °· You have a fever. °· You are leaking fluid from your vagina. °· You have spotting or bleeding from your vagina. °· You have very bad belly cramping or pain. °· You gain or lose weight rapidly. °· You throw up blood. It may look like coffee grounds. °· You are around people who have German measles, fifth disease, or chickenpox. °· You have a very bad headache. °· You have shortness of breath. °· You have any kind of trauma, such as from a fall or a car accident. °Summary °· The first trimester of pregnancy is from week 1 until the end of week 13 (months 1 through 3). °· To take care of yourself and your unborn baby, you will need to eat healthy meals, take medicines only if your doctor tells you to do so, and do activities that are safe for you and your baby. °· Keep all follow-up visits as told by your doctor. This is important as your doctor will have to ensure that your baby is healthy and growing well. °This information is not intended to replace advice given to you by your health care provider. Make sure you discuss any questions you have with your health care provider. °Document Released:  02/07/2008 Document Revised: 08/29/2016 Document Reviewed: 08/29/2016 °Elsevier Interactive Patient Education © 2017 Elsevier Inc. ° °

## 2017-10-21 NOTE — MAU Note (Signed)
Patient here for f/u HCG, denies pain no vaginal bleeding.

## 2017-10-22 LAB — GC/CHLAMYDIA PROBE AMP (~~LOC~~) NOT AT ARMC
Chlamydia: NEGATIVE
Neisseria Gonorrhea: NEGATIVE

## 2018-02-27 ENCOUNTER — Ambulatory Visit (HOSPITAL_COMMUNITY): Payer: BC Managed Care – PPO | Admitting: Psychiatry

## 2018-03-04 ENCOUNTER — Other Ambulatory Visit: Payer: Self-pay

## 2018-03-04 ENCOUNTER — Encounter: Payer: Self-pay | Admitting: Family Medicine

## 2018-03-04 ENCOUNTER — Ambulatory Visit: Payer: BC Managed Care – PPO | Admitting: Family Medicine

## 2018-03-04 VITALS — BP 108/72 | HR 123 | Temp 99.1°F | Resp 17 | Ht 59.0 in | Wt 124.4 lb

## 2018-03-04 DIAGNOSIS — R350 Frequency of micturition: Secondary | ICD-10-CM

## 2018-03-04 DIAGNOSIS — N3001 Acute cystitis with hematuria: Secondary | ICD-10-CM

## 2018-03-04 DIAGNOSIS — N390 Urinary tract infection, site not specified: Secondary | ICD-10-CM | POA: Diagnosis not present

## 2018-03-04 LAB — POCT URINALYSIS DIP (MANUAL ENTRY)
Bilirubin, UA: NEGATIVE
GLUCOSE UA: NEGATIVE mg/dL
Ketones, POC UA: NEGATIVE mg/dL
NITRITE UA: POSITIVE — AB
Protein Ur, POC: 100 mg/dL — AB
Spec Grav, UA: 1.02 (ref 1.010–1.025)
Urobilinogen, UA: 0.2 E.U./dL
pH, UA: 6 (ref 5.0–8.0)

## 2018-03-04 MED ORDER — NITROFURANTOIN MONOHYD MACRO 100 MG PO CAPS: 100.0000 mg | ORAL_CAPSULE | Freq: Two times a day (BID) | ORAL | 0 refills | Status: DC

## 2018-03-04 NOTE — Patient Instructions (Addendum)
   IF you received an x-ray today, you will receive an invoice from [facility]. Please contact [facility] at [phone] with questions or concerns regarding your invoice.   IF you received labwork today, you will receive an invoice from [facility]. Please contact [facility] at [phone] with questions or concerns regarding your invoice.   Our billing staff will not be able to assist you with questions regarding bills from these companies.  You will be contacted with the lab results as soon as they are available. The fastest way to get your results is to activate your My Chart account. Instructions are located on the last page of this paperwork. If you have not heard from us regarding the results in 2 weeks, please contact this office.      Urinary Tract Infection, Adult A urinary tract infection (UTI) is an infection of any part of the urinary tract, which includes the kidneys, ureters, bladder, and urethra. These organs make, store, and get rid of urine in the body. UTI can be a bladder infection (cystitis) or kidney infection (pyelonephritis). What are the causes? This infection may be caused by fungi, viruses, or bacteria. Bacteria are the most common cause of UTIs. This condition can also be caused by repeated incomplete emptying of the bladder during urination. What increases the risk? This condition is more likely to develop if:  You ignore your need to urinate or hold urine for long periods of time.  You do not empty your bladder completely during urination.  You wipe back to front after urinating or having a bowel movement, if you are female.  You are uncircumcised, if you are female.  You are constipated.  You have a urinary catheter that stays in place (indwelling).  You have a weak defense (immune) system.  You have a medical condition that affects your bowels, kidneys, or bladder.  You have diabetes.  You take antibiotic medicines frequently or  for long periods of time, and the antibiotics no longer work well against certain types of infections (antibiotic resistance).  You take medicines that irritate your urinary tract.  You are exposed to chemicals that irritate your urinary tract.  You are female.  What are the signs or symptoms? Symptoms of this condition include:  Fever.  Frequent urination or passing small amounts of urine frequently.  Needing to urinate urgently.  Pain or burning with urination.  Urine that smells bad or unusual.  Cloudy urine.  Pain in the lower abdomen or back.  Trouble urinating.  Blood in the urine.  Vomiting or being less hungry than normal.  Diarrhea or abdominal pain.  Vaginal discharge, if you are female.  How is this diagnosed? This condition is diagnosed with a medical history and physical exam. You will also need to provide a urine sample to test your urine. Other tests may be done, including:  Blood tests.  Sexually transmitted disease (STD) testing.  If you have had more than one UTI, a cystoscopy or imaging studies may be done to determine the cause of the infections. How is this treated? Treatment for this condition often includes a combination of two or more of the following:  Antibiotic medicine.  Other medicines to treat less common causes of UTI.  Over-the-counter medicines to treat pain.  Drinking enough water to stay hydrated.  Follow these instructions at home:  Take over-the-counter and prescription medicines only as told by your health care provider.  If you were prescribed an antibiotic, take it as   told by your health care provider. Do not stop taking the antibiotic even if you start to feel better.  Avoid alcohol, caffeine, tea, and carbonated beverages. They can irritate your bladder.  Drink enough fluid to keep your urine clear or pale yellow.  Keep all follow-up visits as told by your health care provider. This is important.  Make sure  to: ? Empty your bladder often and completely. Do not hold urine for long periods of time. ? Empty your bladder before and after sex. ? Wipe from front to back after a bowel movement if you are female. Use each tissue one time when you wipe. Contact a health care provider if:  You have back pain.  You have a fever.  You feel nauseous or vomit.  Your symptoms do not get better after 3 days.  Your symptoms go away and then return. Get help right away if:  You have severe back pain or lower abdominal pain.  You are vomiting and cannot keep down any medicines or water. This information is not intended to replace advice given to you by your health care provider. Make sure you discuss any questions you have with your health care provider. Document Released: 05/31/2005 Document Revised: 02/02/2016 Document Reviewed: 07/12/2015 Elsevier Interactive Patient Education  2018 Elsevier Inc.  

## 2018-03-04 NOTE — Progress Notes (Signed)
Chief Complaint  Patient presents with  . Urinary Tract Infection    urgency and frequency x 3-4 days, some belly pain.  Per pt she took some otc uti med but no relief    HPI   Urinary Tract Infection: Patient complains of frequency, suprapubic pressure and urgency She has had symptoms for 4 days.  Patient denies back pain, congestion, cough, fever, headache, sorethroat, stomach ache and vaginal discharge. Patient does not have a history of recurrent UTI.  Patient does not have a history of pyelonephritis.     Past Medical History:  Diagnosis Date  . Anemia, antepartum(648.23)   . Medical history non-contributory   . Other abscess of vulva   . SVD (spontaneous vaginal delivery) 08/05/2013    Current Outpatient Medications  Medication Sig Dispense Refill  . nitrofurantoin, macrocrystal-monohydrate, (MACROBID) 100 MG capsule Take 1 capsule (100 mg total) by mouth 2 (two) times daily. 10 capsule 0   No current facility-administered medications for this visit.     Allergies: No Known Allergies  Past Surgical History:  Procedure Laterality Date  . WISDOM TOOTH EXTRACTION      Social History   Socioeconomic History  . Marital status: Single    Spouse name: Not on file  . Number of children: Not on file  . Years of education: Not on file  . Highest education level: Not on file  Occupational History  . Not on file  Social Needs  . Financial resource strain: Not on file  . Food insecurity:    Worry: Not on file    Inability: Not on file  . Transportation needs:    Medical: Not on file    Non-medical: Not on file  Tobacco Use  . Smoking status: Never Smoker  . Smokeless tobacco: Never Used  Substance and Sexual Activity  . Alcohol use: No  . Drug use: No  . Sexual activity: Yes    Birth control/protection: None  Lifestyle  . Physical activity:    Days per week: Not on file    Minutes per session: Not on file  . Stress: Not on file  Relationships  . Social  connections:    Talks on phone: Not on file    Gets together: Not on file    Attends religious service: Not on file    Active member of club or organization: Not on file    Attends meetings of clubs or organizations: Not on file    Relationship status: Not on file  Other Topics Concern  . Not on file  Social History Narrative  . Not on file    Family History  Adopted: Yes  Family history unknown: Yes     ROS Review of Systems See HPI Constitution: No fevers or chills No malaise No diaphoresis Skin: No rash or itching Eyes: no blurry vision, no double vision GU: see hpi Neuro: no dizziness or headaches all others reviewed and negative   Objective: Vitals:   03/04/18 1623  BP: 108/72  Pulse: (!) 123  Resp: 17  Temp: 99.1 F (37.3 C)  SpO2: 99%  Weight: 124 lb 6.4 oz (56.4 kg)  Height: 4\' 11"  (1.499 m)    Physical Exam  Physical Exam  Constitutional: She is oriented to person, place, and time. She appears well-developed and well-nourished.  HENT:  Head: Normocephalic and atraumatic.  Eyes: Conjunctivae and EOM are normal.  Cardiovascular: Normal rate, regular rhythm and normal heart sounds.   Pulmonary/Chest: Effort normal and breath  sounds normal. No respiratory distress. She has no wheezes.  Abdominal: Normal appearance and bowel sounds are normal. There is no tenderness. There is no CVA tenderness.  Neurological: She is alert and oriented to person, place, and time.     Assessment and Plan Telesia was seen today for urinary tract infection.  Diagnoses and all orders for this visit:  Urinary frequency -     POCT urinalysis dipstick  Acute UTI -     Urine Culture -     nitrofurantoin, macrocrystal-monohydrate, (MACROBID) 100 MG capsule; Take 1 capsule (100 mg total) by mouth 2 (two) times daily.  -  Advised pt to continue hydration Will treat with Macrobid and send urine culture    Khamora Karan A Lia Vigilante

## 2018-03-06 LAB — URINE CULTURE

## 2018-03-13 ENCOUNTER — Other Ambulatory Visit: Payer: Self-pay

## 2018-03-13 ENCOUNTER — Ambulatory Visit: Payer: BC Managed Care – PPO | Admitting: Physician Assistant

## 2018-03-13 ENCOUNTER — Encounter: Payer: Self-pay | Admitting: Physician Assistant

## 2018-03-13 VITALS — BP 110/64 | HR 93 | Temp 99.1°F | Resp 18 | Ht 59.0 in | Wt 127.8 lb

## 2018-03-13 DIAGNOSIS — R41840 Attention and concentration deficit: Secondary | ICD-10-CM

## 2018-03-13 DIAGNOSIS — R3915 Urgency of urination: Secondary | ICD-10-CM | POA: Diagnosis not present

## 2018-03-13 DIAGNOSIS — F810 Specific reading disorder: Secondary | ICD-10-CM | POA: Diagnosis not present

## 2018-03-13 DIAGNOSIS — N3000 Acute cystitis without hematuria: Secondary | ICD-10-CM

## 2018-03-13 LAB — POCT URINALYSIS DIP (MANUAL ENTRY)
Bilirubin, UA: NEGATIVE
Glucose, UA: NEGATIVE mg/dL
Nitrite, UA: POSITIVE — AB
Protein Ur, POC: 30 mg/dL — AB
Spec Grav, UA: 1.02 (ref 1.010–1.025)
Urobilinogen, UA: 0.2 E.U./dL
pH, UA: 6 (ref 5.0–8.0)

## 2018-03-13 MED ORDER — CIPROFLOXACIN HCL 500 MG PO TABS
500.0000 mg | ORAL_TABLET | Freq: Two times a day (BID) | ORAL | 0 refills | Status: DC
Start: 2018-03-13 — End: 2018-04-23

## 2018-03-13 NOTE — Progress Notes (Signed)
Diana Farrell  MRN: [id number] DOB: 1996/05/16  PCP: Doristine Bosworth, MD  Chief Complaint  Patient presents with  . Urinary Tract Infection    follow up says she needs more medicine   . Anxiety    wants to be tested for ADHD     Subjective:  Pt presents to clinic for concerns that she is still having urinary even after a 5-day treatment of Macrobid about a week ago.  She is still having urinary urgency and pressure in her bladder.  She took all of the medications without problems.  She is having no vaginal discharge.  But she has had multiple sexual partners without condoms within the past 3 weeks.  She would like to have STD testing when asked about this.  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   she is hoping to get evaluated and may be started on medications before school starts in the fall.  She has always struggled with school, she remembers it starting in 1st.  Failed her EOG every year.  Parents did not have her tested as a child .  Since an adult - she has had trouble keeping jobs.  Scattered thoughts and talking - gets panicked and overwhelmed.  She feels like she struggles with reading - she feels like she reads on about a 6th grade level.   Adopted at 18 months - before that an orphanage in [place]  History is obtained by patient.  Review of Systems  Constitutional: Negative for chills and fever.  Gastrointestinal: Positive for abdominal pain (Pressure). Negative for nausea.  Genitourinary: Positive for dysuria (Mild). Negative for hematuria, menstrual problem and vaginal discharge.    Patient Active Problem List   Diagnosis Date Noted  . SVD (spontaneous vaginal delivery) 08/05/2013  . Postpartum care following vaginal delivery (12/1) 08/04/2013    Current Outpatient Medications on File Prior to Visit  Medication Sig Dispense Refill  . SPRINTEC 28 0.25-35 MG-MCG tablet     . nitrofurantoin, macrocrystal-monohydrate,  (MACROBID) 100 MG capsule Take 1 capsule (100 mg total) by mouth 2 (two) times daily. (Patient not taking: Reported on 03/13/2018) 10 capsule 0   No current facility-administered medications on file prior to visit.     No Known Allergies  Past Medical History:  Diagnosis Date  . Anemia, antepartum(648.23)   . Medical history non-contributory   . Other abscess of vulva   . SVD (spontaneous vaginal delivery) 08/05/2013   Social History   Social History Narrative   Adopted at 18 months from [place].   Social History   Tobacco Use  . Smoking status: Never Smoker  . Smokeless tobacco: Never Used  Substance Use Topics  . Alcohol use: No  . Drug use: No   She was adopted. Family history is unknown by patient.     Objective:  BP 110/64   Pulse 93   Temp 99.1 F (37.3 C) (Oral)   Resp 18   Ht 4\' 11"  (1.499 m)   Wt 127 lb 12.8 oz (58 kg)   LMP 08/20/2017 (LMP Unknown) Comment: pt says sometime in June   SpO2 99%   BMI 25.81 kg/m  Body mass index is 25.81 kg/m.  Wt Readings from Last 3 Encounters:  03/13/18 127 lb 12.8 oz (58 kg)  03/04/18 124 lb 6.4 oz (56.4 kg)  10/19/17 142 lb (64.4 kg)    Physical Exam  Constitutional: She is oriented to person, place,  and time. She appears well-developed and well-nourished.  HENT:  Head: Normocephalic and atraumatic.  Right Ear: Hearing and external ear normal.  Left Ear: Hearing and external ear normal.  Eyes: Conjunctivae are normal.  Neck: Normal range of motion.  Pulmonary/Chest: Effort normal.  Neurological: She is alert and oriented to person, place, and time.  Skin: Skin is warm, dry and intact.  Psychiatric: She has a normal mood and affect. Her behavior is normal. Judgment and thought content normal.  Vitals reviewed.  Results for orders placed or performed in visit on 03/13/18  POCT urinalysis dipstick  Result Value Ref Range   Color, UA yellow yellow   Clarity, UA cloudy (A) clear   Glucose, UA negative  negative mg/dL   Bilirubin, UA negative negative   Ketones, POC UA small (15) (A) negative mg/dL   Spec Grav, UA 1.6101.020 9.6041.010 - 1.025   Blood, UA small (A) negative   pH, UA 6.0 5.0 - 8.0   Protein Ur, POC =30 (A) negative mg/dL   Urobilinogen, UA 0.2 0.2 or 1.0 E.U./dL   Nitrite, UA Positive (A) Negative   Leukocytes, UA Large (3+) (A) Negative    Assessment and Plan :  Urinary urgency - Plan: POCT urinalysis dipstick, WET PREP FOR TRICH, YEAST, CLUE, GC/Chlamydia Probe Amp according to UA today patient still has urinary tract infection.  Did not reculture do culture just having been done last week. -   Acute cystitis without hematuria - Plan: ciprofloxacin (CIPRO) 500 MG tablet-push fluids finish all antibiotics   Inattentiveness sent patient to psychologist for evaluation of ADD -, concern patient may have learning disability/dyslexia due to difficulties reading.  She should also be evaluated for learning disabilities prior to entering trade school in the fall.  This will allow her to get accommodations if needed.  Reading difficulties  Patient verbalized to me that they understand the following: diagnosis, what is being done for them, what to expect and what should be done at home.  Their questions have been answered.  See after visit summary for patient specific instructions.  Benny LennertSarah Liyanna Cartwright PA-C  [facility] 03/13/2018 4:07 PM  Please note: Portions of this report may have been transcribed using dragon voice recognition software. Every effort was made to ensure accuracy; however, inadvertent computerized transcription errors may be present.

## 2018-03-13 NOTE — Patient Instructions (Addendum)
   ADD testing  [facility] - [phone] [facility] - [phone] Wynelle Fannyhomas Hedding [phone]         IF you received an x-ray today, you will receive an [facility]. Please contact [facility] at [phone] with questions or concerns regarding your invoice.   IF you received labwork today, you will receive an invoice from [facility]. Please contact [facility] at [phone] with questions or concerns regarding your invoice.   Our billing staff will not be able to assist you with questions regarding bills from these companies.  You will be contacted with the lab results as soon as they are available. The fastest way to get your results is to activate your My Chart account. Instructions are located on the last page of this paperwork. If you have not heard from us regarding the results in 2 weeks, please contact this office.

## 2018-03-14 LAB — WET PREP FOR TRICH, YEAST, CLUE
CLUE CELL EXAM: NEGATIVE
Trichomonas Exam: NEGATIVE
YEAST EXAM: NEGATIVE

## 2018-03-14 LAB — GC/CHLAMYDIA PROBE AMP
Chlamydia trachomatis, NAA: NEGATIVE
Neisseria gonorrhoeae by PCR: NEGATIVE

## 2018-03-15 ENCOUNTER — Telehealth: Payer: Self-pay | Admitting: Family Medicine

## 2018-03-15 NOTE — Telephone Encounter (Signed)
Patient notified of her lab results.

## 2018-03-15 NOTE — Telephone Encounter (Signed)
Pt mother called stating pt is having a hard time getting thru to us. Please call pt back with lab results at [phone].  Copied from CRM [id number]. Topic: Quick Communication - Lab Results >> Mar 15, 2018  1:51 PM Trudi IdaMabry, Jasmine L, [place] wrote: Called patient to inform them of 03/13/18 lab results. When patient returns call, triage nurse may disclose results.

## 2018-04-23 ENCOUNTER — Encounter: Payer: Self-pay | Admitting: Physician Assistant

## 2018-04-23 ENCOUNTER — Other Ambulatory Visit: Payer: Self-pay

## 2018-04-23 ENCOUNTER — Ambulatory Visit: Payer: BC Managed Care – PPO | Admitting: Physician Assistant

## 2018-04-23 ENCOUNTER — Ambulatory Visit (INDEPENDENT_AMBULATORY_CARE_PROVIDER_SITE_OTHER): Payer: BC Managed Care – PPO | Admitting: Physician Assistant

## 2018-04-23 VITALS — BP 124/82 | HR 82 | Temp 98.9°F | Resp 16 | Ht 59.0 in | Wt 133.0 lb

## 2018-04-23 DIAGNOSIS — N898 Other specified noninflammatory disorders of vagina: Secondary | ICD-10-CM | POA: Diagnosis not present

## 2018-04-23 DIAGNOSIS — N760 Acute vaginitis: Secondary | ICD-10-CM

## 2018-04-23 DIAGNOSIS — Z113 Encounter for screening for infections with a predominantly sexual mode of transmission: Secondary | ICD-10-CM

## 2018-04-23 DIAGNOSIS — R300 Dysuria: Secondary | ICD-10-CM | POA: Diagnosis not present

## 2018-04-23 LAB — POCT URINALYSIS DIP (MANUAL ENTRY)
Bilirubin, UA: NEGATIVE
Glucose, UA: NEGATIVE mg/dL
Ketones, POC UA: NEGATIVE mg/dL
Leukocytes, UA: NEGATIVE
Nitrite, UA: NEGATIVE
Protein Ur, POC: NEGATIVE mg/dL
Spec Grav, UA: 1.025 (ref 1.010–1.025)
Urobilinogen, UA: 0.2 E.U./dL
pH, UA: 7 (ref 5.0–8.0)

## 2018-04-23 LAB — POCT WET + KOH PREP
Trich by wet prep: ABSENT
Yeast by KOH: ABSENT
Yeast by wet prep: ABSENT

## 2018-04-23 MED ORDER — FLUCONAZOLE 150 MG PO TABS: 150.0000 mg | ORAL_TABLET | Freq: Once | ORAL | 0 refills | Status: DC

## 2018-04-23 MED ORDER — METRONIDAZOLE 0.75 % VA GEL: 1.0000 | Freq: Every day | VAGINAL | 0 refills | Status: AC

## 2018-04-23 MED ORDER — FLUCONAZOLE 150 MG PO TABS: 150.0000 mg | ORAL_TABLET | Freq: Once | ORAL | 0 refills | Status: AC

## 2018-04-23 MED ORDER — METRONIDAZOLE 0.75 % VA GEL: 1.0000 | Freq: Every day | VAGINAL | 0 refills | Status: DC

## 2018-04-23 NOTE — Addendum Note (Signed)
Addended by: Sebastian AcheMCVEY, Deltha Bernales WHITNEY on: 04/23/2018 06:33 PM   Modules accepted: Orders

## 2018-04-23 NOTE — Progress Notes (Signed)
Ross LudwigBrighita Rudman  MRN: [id number] DOB: March 14, 1996  PCP: Doristine BosworthStallings, Zoe A, MD  Subjective:  Pt is a 22 year old female who presents to clinic for vaginal itching, burning and discharge x 3 days. Discharge is white and clumpy.  Itching on the outside, some on the inside.   2-3 new sexual partners in the past few months. Intermittent condom use. Would like to be tested for STDs.  PAP done 04/2017 - negative.  OCPs for birth control  No missed periods.   Review of Systems  Constitutional: Negative for chills, fatigue and fever.  Cardiovascular: Negative for chest pain and palpitations.  Gastrointestinal: Negative for abdominal pain, diarrhea, nausea and vomiting.  Genitourinary: Positive for vaginal discharge and vaginal pain ("itching"). Negative for decreased urine volume, difficulty urinating, dysuria, enuresis, flank pain, frequency, hematuria and urgency.  Musculoskeletal: Negative for back pain.  Neurological: Negative for dizziness, weakness, light-headedness and headaches.    There are no active problems to display for this patient.   Current Outpatient Medications on File Prior to Visit  Medication Sig Dispense Refill  . SPRINTEC 28 0.25-35 MG-MCG tablet      No current facility-administered medications on file prior to visit.     No Known Allergies   Objective:  BP 124/82 (BP Location: Right Arm, Patient Position: Sitting, Cuff Size: Normal)   Pulse 82   Temp 98.9 F (37.2 C) (Oral)   Resp 16   Ht 4\' 11"  (1.499 m)   Wt 133 lb (60.3 kg)   LMP 03/08/2017   SpO2 98%   BMI 26.86 kg/m   Physical Exam  Constitutional: She is oriented to person, place, and time. No distress.  Genitourinary: Vagina normal and uterus normal. Cervix exhibits no motion tenderness and no discharge.  Genitourinary Comments: Excoriation right labial fold.   Neurological: She is alert and oriented to person, place, and time.  Skin: Skin is warm and dry.  Psychiatric: Judgment normal.    Vitals reviewed.  Results for orders placed or performed in visit on 04/23/18  POCT Wet + KOH Prep  Result Value Ref Range   Yeast by KOH Absent Absent   Yeast by wet prep Absent Absent   WBC by wet prep Few Few   Clue Cells Wet Prep HPF POC None None   Trich by wet prep Absent Absent   Bacteria Wet Prep HPF POC Many (A) Few   Epithelial Cells By Principal FinancialWet Pref ([facility]) Moderate (A) None, Few, Too numerous to count   RBC,UR,HPF,POC None None RBC/hpf  POCT urinalysis dipstick  Result Value Ref Range   Color, UA yellow yellow   Clarity, UA clear clear   Glucose, UA negative negative mg/dL   Bilirubin, UA negative negative   Ketones, POC UA negative negative mg/dL   Spec Grav, UA [id number] 2.9521.010 - 1.025   Blood, UA trace-lysed (A) negative   pH, UA 7.0 5.0 - 8.0   Protein Ur, POC negative negative mg/dL   Urobilinogen, UA 0.2 0.2 or 1.0 E.U./dL   Nitrite, UA Negative Negative   Leukocytes, UA Negative Negative    Assessment and Plan :  1. Vulvovaginitis 2. Vaginal discharge - Wet prep negative for BV, trichomonas or yeast. +bacteria, will treat with metrogel. HPI suggestive of yeast, plan to treat for her symptoms with Diflucan. RTC if no improvement.  - POCT Wet + KOH Prep - fluconazole (DIFLUCAN) 150 MG tablet; Take 1 tablet (150 mg total) by mouth once for 1 dose. Repeat if  needed  Dispense: 2 tablet; Refill: 0 - metroNIDAZOLE (METROGEL VAGINAL) 0.75 % vaginal gel; Place 1 Applicatorful vaginally at bedtime for 5 days.  Dispense: 70 g; Refill: 0  3. Dysuria - UA is negative.  - POCT urinalysis dipstick  4. Screen for STD (sexually transmitted disease) - Chlamydia/Gonococcus/Trichomonas, NAA - HIV antibody - RPR   Marco CollieWhitney Zhane Donlan, PA-C  [facility] 04/23/2018 5:18 PM  Please note: Portions of this report may have been transcribed using dragon voice recognition software. Every effort was made to ensure accuracy; however, inadvertent  computerized transcription errors may be present.

## 2018-04-23 NOTE — Patient Instructions (Addendum)
You do not have a UTI, BV or a yeast infection. I am treating you today for vulvovaginitis.  Take one dose of Diflucan. (you can repeat this after 72 hours if you are still having symptoms) Apply one Metrogel suppository at night before bed for the next 5 nights.    You will be contacted with your lab results within the next 2 weeks.  If you have not heard from [place] then please contact us. The fastest way to get your results is to register for My Chart.   Vaginitis Vaginitis is a condition in which the vaginal tissue swells and becomes red (inflamed). This condition is most often caused by a change in the normal balance of bacteria and yeast that live in the vagina. This change causes an overgrowth of certain bacteria or yeast, which causes the inflammation. There are different types of vaginitis, but the most common types are:  Bacterial vaginosis.  Yeast infection (candidiasis).  Trichomoniasis vaginitis. This is a sexually transmitted disease (STD).  Viral vaginitis.  Atrophic vaginitis.  Allergic vaginitis.  What are the causes? The cause of this condition depends on the type of vaginitis. It can be caused by:  Bacteria (bacterial vaginosis).  Yeast, which is a fungus (yeast infection).  A parasite (trichomoniasis vaginitis).  A virus (viral vaginitis).  Low hormone levels (atrophic vaginitis). Low hormone levels can occur during pregnancy, breastfeeding, or after menopause.  Irritants, such as bubble baths, scented tampons, and feminine sprays (allergic vaginitis).  Other factors can change the normal balance of the yeast and bacteria that live in the vagina. These include:  Antibiotic medicines.  Poor hygiene.  Diaphragms, vaginal sponges, spermicides, birth control pills, and intrauterine devices (IUD).  Sex.  Infection.  Uncontrolled diabetes.  A weakened defense (immune) system.  What increases the risk? This condition is more likely to develop in women  who:  Smoke.  Use vaginal douches, scented tampons, or scented sanitary pads.  Wear tight-fitting pants.  Wear thong underwear.  Use oral birth control pills or an IUD.  Have sex without a condom.  Have multiple sex partners.  Have an STD.  Frequently use the spermicide nonoxynol-9.  Eat lots of foods high in sugar.  Have uncontrolled diabetes.  Have low estrogen levels.  Have a weakened immune system from an immune disorder or medical treatment.  Are pregnant or breastfeeding.  What are the signs or symptoms? Symptoms vary depending on the cause of the vaginitis. Common symptoms include:  Abnormal vaginal discharge. ? The discharge is white, gray, or yellow with bacterial vaginosis. ? The discharge is thick, white, and cheesy with a yeast infection. ? The discharge is frothy and yellow or greenish with trichomoniasis.  A bad vaginal smell. The smell is fishy with bacterial vaginosis.  Vaginal itching, pain, or swelling.  Sex that is painful.  Pain or burning when urinating.  Sometimes there are no symptoms. How is this diagnosed? This condition is diagnosed based on your symptoms and medical history. A physical exam, including a pelvic exam, will also be done. You may also have other tests, including:  Tests to determine the pH level (acidity or alkalinity) of your vagina.  A whiff test, to assess the odor that results when a sample of your vaginal discharge is mixed with a potassium hydroxide solution.  Tests of vaginal fluid. A sample will be examined under a microscope.  How is this treated? Treatment varies depending on the type of vaginitis you have. Your treatment may  include:  Antibiotic creams or pills to treat bacterial vaginosis and trichomoniasis.  Antifungal medicines, such as vaginal creams or suppositories, to treat a yeast infection.  Medicine to ease discomfort if you have viral vaginitis. Your sexual partner should also be  treated.  Estrogen delivered in a cream, pill, suppository, or vaginal ring to treat atrophic vaginitis. If vaginal dryness occurs, lubricants and moisturizing creams may help. You may need to avoid scented soaps, sprays, or douches.  Stopping use of a product that is causing allergic vaginitis. Then using a vaginal cream to treat the symptoms.  Follow these instructions at home: Lifestyle  Keep your genital area clean and dry. Avoid soap, and only rinse the area with water.  Do not douche or use tampons until your health care provider says it is okay to do so. Use sanitary pads, if needed.  Do not have sex until your health care provider approves. When you can return to sex, practice safe sex and use condoms.  Wipe from front to back. This avoids the spread of bacteria from the rectum to the vagina. General instructions  Take over-the-counter and prescription medicines only as told by your health care provider.  If you were prescribed an antibiotic medicine, take or use it as told by your health care provider. Do not stop taking or using the antibiotic even if you start to feel better.  Keep all follow-up visits as told by your health care provider. This is important. How is this prevented?  Use mild, non-scented products. Do not use things that can irritate the vagina, such as fabric softeners. Avoid the following products if they are scented: ? Feminine sprays. ? Detergents. ? Tampons. ? Feminine hygiene products. ? Soaps or bubble baths.  Let air reach your genital area. ? Wear cotton underwear to reduce moisture buildup. ? Avoid wearing underwear while you sleep. ? Avoid wearing tight pants and underwear or nylons without a cotton panel. ? Avoid wearing thong underwear.  Take off any wet clothing, such as bathing suits, as soon as possible.  Practice safe sex and use condoms. Contact a health care provider if:  You have abdominal pain.  You have a fever.  You have  symptoms that last for more than 2-3 days. Get help right away if:  You have a fever and your symptoms suddenly get worse. Summary  Vaginitis is a condition in which the vaginal tissue becomes inflamed.This condition is most often caused by a change in the normal balance of bacteria and yeast that live in the vagina.  Treatment varies depending on the type of vaginitis you have.  Do not douche, use tampons , or have sex until your health care provider approves. When you can return to sex, practice safe sex and use condoms. This information is not intended to replace advice given to you by your health care provider. Make sure you discuss any questions you have with your health care provider. Document Released: 06/18/2007 Document Revised: 09/26/2016 Document Reviewed: 09/26/2016 Elsevier Interactive Patient Education  2018 [place] Sex Practicing safe sex means taking steps before and during sex to reduce your risk of:  Getting an STD (sexually transmitted disease).  Giving your partner an STD.  Unwanted pregnancy.  How can I practice safe sex?  To practice safe sex:  Limit your sexual partners to only one partner who is having sex with only you.  Avoid using alcohol and recreational drugs before having sex. These substances can affect your  judgment.  Before having sex with a new partner: ? Talk to your partner about past partners, past STDs, and drug use. ? You and your partner should be screened for STDs and discuss the results with each other.  Check your body regularly for sores, blisters, rashes, or unusual discharge. If you notice any of these problems, visit your health care provider.  If you have symptoms of an infection or you are being treated for an STD, avoid sexual contact.  While having sex, use a condom. Make sure to: ? Use a condom every time you have vaginal, oral, or anal sex. Both females and males should wear condoms during oral sex. ? Keep  condoms in place from the beginning to the end of sexual activity. ? Use a latex condom, if possible. Latex condoms offer the best protection. ? Use only water-based lubricants or oils to lubricate a condom. Using petroleum-based lubricants or oils will weaken the condom and increase the chance that it will break.  See your health care provider for regular screenings, exams, and tests for STDs.  Talk with your health care provider about the form of birth control (contraception) that is best for you.  Get vaccinated against hepatitis B and human papillomavirus (HPV).  If you are at risk of being infected with HIV (human immunodeficiency virus), talk with your health care provider about taking a prescription medicine to prevent HIV infection. You are considered at risk for HIV if: ? You are a man who has sex with other men. ? You are a heterosexual man or woman who is sexually active with more than one partner. ? You take drugs by injection. ? You are sexually active with a partner who has HIV.  This information is not intended to replace advice given to you by your health care provider. Make sure you discuss any questions you have with your health care provider. Document Released: 09/28/2004 Document Revised: 01/05/2016 Document Reviewed: 07/11/2015 Elsevier Interactive Patient Education  [employer].  Thank you for coming in today. I hope you feel we met your needs.  Feel free to call PCP if you have any questions or further requests.  Please consider signing up for MyChart if you do not already have it, as this is a great way to communicate with me.  Best,  Whitney McVey, PA-C  IF you received an x-ray today, you will receive an invoice from [facility]. Please contact [facility] at [phone] with questions or concerns regarding your invoice.   IF you received labwork today, you will receive an invoice from [facility]. Please contact [facility] at [phone]  with questions or concerns regarding your invoice.   Our billing staff will not be able to assist you with questions regarding bills from these companies.  You will be contacted with the lab results as soon as they are available. The fastest way to get your results is to activate your My Chart account. Instructions are located on the last page of this paperwork. If you have not heard from [place] regarding the results in 2 weeks, please contact this office.

## 2018-04-24 LAB — RPR: RPR Ser Ql: NONREACTIVE

## 2018-04-24 LAB — HIV ANTIBODY (ROUTINE TESTING W REFLEX): HIV Screen 4th Generation wRfx: NONREACTIVE

## 2018-04-25 LAB — CHLAMYDIA/GONOCOCCUS/TRICHOMONAS, NAA
Chlamydia by NAA: NEGATIVE
Gonococcus by NAA: NEGATIVE
Trich vag by NAA: NEGATIVE

## 2018-04-26 ENCOUNTER — Encounter: Payer: Self-pay | Admitting: Physician Assistant

## 2018-04-26 ENCOUNTER — Telehealth: Payer: Self-pay | Admitting: Family Medicine

## 2018-04-26 NOTE — Telephone Encounter (Signed)
Please advise 

## 2018-04-26 NOTE — Progress Notes (Signed)
STD panel is negative. Result letter sent in mail

## 2018-04-26 NOTE — Telephone Encounter (Signed)
Copied from CRM [phone]. Topic: Quick Communication - See Telephone Encounter >> Apr 26, 2018  9:09 AM Luanna Coleawoud, Jessica L wrote: CRM for notification. See Telephone encounter for: 04/26/18. Pt called and stated that she was seen in the office 04/23/18 for a yeast infection and she is still having burning sensation around her vulva area. Pt would like to be perscribed a stronger medication. Please advise

## 2018-05-04 ENCOUNTER — Other Ambulatory Visit: Payer: Self-pay | Admitting: Family Medicine

## 2018-05-04 DIAGNOSIS — Z30011 Encounter for initial prescription of contraceptive pills: Secondary | ICD-10-CM

## 2018-05-09 NOTE — Telephone Encounter (Signed)
Has she taken the second dose of diflucan? If not, try that

## 2018-05-10 NOTE — Telephone Encounter (Signed)
Advised pt per [facility] if she hasn taken the second dose of diflucan to try that to see if it helps.  Pt advises it was a week ago since her call I advised I was aware and apologize but am doing calls now and trying to address issues regardless of how long its been.  Pt agreeable and hung up. Dgaddy, CMA

## 2018-07-05 ENCOUNTER — Other Ambulatory Visit: Payer: Self-pay | Admitting: Family Medicine

## 2018-07-05 DIAGNOSIS — Z30011 Encounter for initial prescription of contraceptive pills: Secondary | ICD-10-CM

## 2018-07-05 NOTE — Telephone Encounter (Signed)
Called patient scheduled OV for medication refill.

## 2018-07-10 ENCOUNTER — Other Ambulatory Visit: Payer: Self-pay | Admitting: Family Medicine

## 2018-07-10 DIAGNOSIS — Z30011 Encounter for initial prescription of contraceptive pills: Secondary | ICD-10-CM

## 2018-08-06 ENCOUNTER — Telehealth: Payer: Self-pay | Admitting: Family Medicine

## 2018-08-06 ENCOUNTER — Ambulatory Visit: Payer: Self-pay | Admitting: Family Medicine

## 2018-08-06 NOTE — Progress Notes (Deleted)
  No chief complaint on file.   HPI  4 review of systems  Past Medical History:  Diagnosis Date  . Anemia, antepartum(648.23)   . Medical history non-contributory   . Other abscess of vulva   . SVD (spontaneous vaginal delivery) 08/05/2013    Current Outpatient Medications  Medication Sig Dispense Refill  . SPRINTEC 28 0.25-35 MG-MCG tablet     . SPRINTEC 28 0.25-35 MG-MCG tablet TAKE 1 TABLET BY MOUTH EVERY DAY 90 tablet 0   No current facility-administered medications for this visit.     Allergies: No Known Allergies  Past Surgical History:  Procedure Laterality Date  . WISDOM TOOTH EXTRACTION      Social History   Socioeconomic History  . Marital status: Single    Spouse name: Not on file  . Number of children: Not on file  . Years of education: Not on file  . Highest education level: Not on file  Occupational History  . Not on file  Social Needs  . Financial resource strain: Not on file  . Food insecurity:    Worry: Not on file    Inability: Not on file  . Transportation needs:    Medical: Not on file    Non-medical: Not on file  Tobacco Use  . Smoking status: Never Smoker  . Smokeless tobacco: Never Used  Substance and Sexual Activity  . Alcohol use: No  . Drug use: No  . Sexual activity: Yes    Birth control/protection: Pill  Lifestyle  . Physical activity:    Days per week: Not on file    Minutes per session: Not on file  . Stress: Not on file  Relationships  . Social connections:    Talks on phone: Not on file    Gets together: Not on file    Attends religious service: Not on file    Active member of club or organization: Not on file    Attends meetings of clubs or organizations: Not on file    Relationship status: Not on file  Other Topics Concern  . Not on file  Social History Narrative   Adopted at 218 months from [place].    Family History  Adopted: Yes  Family history unknown: Yes     ROS Review of Systems See  HPI Constitution: No fevers or chills No malaise No diaphoresis Skin: No rash or itching Eyes: no blurry vision, no double vision GU: no dysuria or hematuria Neuro: no dizziness or headaches * all others reviewed and negative   Objective: There were no vitals filed for this visit.  Physical Exam  Assessment and Plan There are no diagnoses linked to this encounter.   Boris Engelmann P PPL Corporationaddy

## 2018-08-06 NOTE — Telephone Encounter (Signed)
Spoke with pt regarding CRM [id number]. I was able to make pt an appt with DrCreta Levin.. Stallings on 08/21/18 at 11:00. I advised of time, building and late policy. Pt acknowledged.

## 2018-08-21 ENCOUNTER — Ambulatory Visit: Payer: BC Managed Care – PPO | Admitting: Family Medicine

## 2018-10-04 ENCOUNTER — Other Ambulatory Visit: Payer: Self-pay

## 2018-10-04 ENCOUNTER — Ambulatory Visit (INDEPENDENT_AMBULATORY_CARE_PROVIDER_SITE_OTHER): Payer: BC Managed Care – PPO | Admitting: Family Medicine

## 2018-10-04 ENCOUNTER — Encounter: Payer: Self-pay | Admitting: Family Medicine

## 2018-10-04 VITALS — BP 107/70 | HR 70 | Temp 98.3°F | Resp 14 | Ht 59.0 in | Wt 119.8 lb

## 2018-10-04 DIAGNOSIS — Z3041 Encounter for surveillance of contraceptive pills: Secondary | ICD-10-CM | POA: Diagnosis not present

## 2018-10-04 DIAGNOSIS — F411 Generalized anxiety disorder: Secondary | ICD-10-CM | POA: Diagnosis not present

## 2018-10-04 DIAGNOSIS — F410 Panic disorder [episodic paroxysmal anxiety] without agoraphobia: Secondary | ICD-10-CM | POA: Diagnosis not present

## 2018-10-04 DIAGNOSIS — Z113 Encounter for screening for infections with a predominantly sexual mode of transmission: Secondary | ICD-10-CM | POA: Diagnosis not present

## 2018-10-04 DIAGNOSIS — R41840 Attention and concentration deficit: Secondary | ICD-10-CM

## 2018-10-04 LAB — POCT URINE PREGNANCY: Preg Test, Ur: NEGATIVE

## 2018-10-04 MED ORDER — NORGESTIMATE-ETH ESTRADIOL 0.25-35 MG-MCG PO TABS: 1.0000 | ORAL_TABLET | Freq: Every day | ORAL | 3 refills | Status: DC

## 2018-10-04 MED ORDER — NORGESTIMATE-ETH ESTRADIOL 0.25-35 MG-MCG PO TABS: 1.0000 | ORAL_TABLET | Freq: Every day | ORAL | 0 refills | Status: DC

## 2018-10-04 NOTE — Progress Notes (Signed)
Established Patient Office Visit  Subjective:  Patient ID: Diana Farrell, female    DOB: Mar 05, 1996  Age: 23 y.o. MRN: [id number]  CC:  Chief Complaint  Patient presents with  . Anxiety    chest pain and sweats when having an anxiety, get overwhelmed quick.  . STD check    just would like to get checked not having any symptoms  . Contraception    refill on sprintec 28    HPI Diana Farrell presents for   Patient reports that she has been taking her parents anxiety medication - alprazolam 0.5mg  She states she has had difficulty with attention and focus as well as social anxiety which is impacting her school performance  Right now she is getting C's and B's at [employer] She reports that she always failed her EOG and she reports that she has learning delays.  GAD 7 : Generalized Anxiety Score 10/04/2018  Nervous, Anxious, on Edge 0  Control/stop worrying 0  Worry too much - different things 1  Trouble relaxing 1  Restless 0  Easily annoyed or irritable 2  Afraid - awful might happen 3  Total GAD 7 Score 7  Anxiety Difficulty Not difficult at all    Contraception She is currently on sprintec 28 and is doing well  Without migraines, mood swings or weight changes Patient's last menstrual period was 09/20/2018. She is not a smoker  Screening for STDs She also desires screenings for stds She is sexually active Does not use condoms She has one partner  Female partner   Past Medical History:  Diagnosis Date  . Anemia, antepartum(648.23)   . Medical history non-contributory   . Other abscess of vulva   . SVD (spontaneous vaginal delivery) 08/05/2013    Past Surgical History:  Procedure Laterality Date  . WISDOM TOOTH EXTRACTION      Family History  Adopted: Yes  Family history unknown: Yes    Social History   Socioeconomic History  . Marital status: Single    Spouse name: Not on file  . Number of children: Not on file  . Years of education: Not on file  .  Highest education level: Not on file  Occupational History  . Not on file  Social Needs  . Financial resource strain: Not on file  . Food insecurity:    Worry: Not on file    Inability: Not on file  . Transportation needs:    Medical: Not on file    Non-medical: Not on file  Tobacco Use  . Smoking status: Never Smoker  . Smokeless tobacco: Never Used  Substance and Sexual Activity  . Alcohol use: No  . Drug use: No  . Sexual activity: Yes    Birth control/protection: Pill  Lifestyle  . Physical activity:    Days per week: Not on file    Minutes per session: Not on file  . Stress: Not on file  Relationships  . Social connections:    Talks on phone: Not on file    Gets together: Not on file    Attends religious service: Not on file    Active member of club or organization: Not on file    Attends meetings of clubs or organizations: Not on file    Relationship status: Not on file  . Intimate partner violence:    Fear of current or ex partner: Not on file    Emotionally abused: Not on file    Physically abused: Not on file  Forced sexual activity: Not on file  Other Topics Concern  . Not on file  Social History Narrative   Adopted at 96 months from [place].    Outpatient Medications Prior to Visit  Medication Sig Dispense Refill  . amphetamine-dextroamphetamine (ADDERALL) 20 MG tablet Take 20 mg by mouth daily.    . SPRINTEC 28 0.25-35 MG-MCG tablet     . SPRINTEC 28 0.25-35 MG-MCG tablet TAKE 1 TABLET BY MOUTH EVERY DAY 90 tablet 0   No facility-administered medications prior to visit.     No Known Allergies  ROS Review of Systems Review of Systems  Constitutional: Negative for activity change, appetite change, chills and fever.  HENT: Negative for congestion, nosebleeds, trouble swallowing and voice change.   Respiratory: Negative for cough, shortness of breath and wheezing.   Gastrointestinal: Negative for diarrhea, nausea and vomiting.  Genitourinary:  Negative for difficulty urinating, dysuria, flank pain and hematuria.  Musculoskeletal: Negative for back pain, joint swelling and neck pain.  Neurological: Negative for dizziness, speech difficulty, light-headedness and numbness.  See HPI. All other review of systems negative.     Objective:    Physical Exam  BP 107/70 (BP Location: Right Arm, Patient Position: Sitting, Cuff Size: Normal)   Pulse 70   Temp 98.3 F (36.8 C) (Oral)   Resp 14   Ht 4\' 11"  (1.499 m)   Wt 119 lb 12.8 oz (54.3 kg)   LMP 09/20/2018   SpO2 97%   BMI 24.20 kg/m  Wt Readings from Last 3 Encounters:  10/04/18 119 lb 12.8 oz (54.3 kg)  04/23/18 133 lb (60.3 kg)  03/13/18 127 lb 12.8 oz (58 kg)   Physical Exam  Constitutional: Oriented to person, place, and time. Appears well-developed and well-nourished.  HENT:  Head: Normocephalic and atraumatic.  Eyes: Conjunctivae and EOM are normal.  Cardiovascular: Normal rate, regular rhythm, normal heart sounds and intact distal pulses.  No murmur heard. Pulmonary/Chest: Effort normal and breath sounds normal. No stridor. No respiratory distress. Has no wheezes.  Neurological: Is alert and oriented to person, place, and time.  Skin: Skin is warm. Capillary refill takes less than 2 seconds.  Psychiatric: Has a normal mood and affect. Behavior is normal. Judgment and thought content normal.    There are no preventive care reminders to display for this patient.  There are no preventive care reminders to display for this patient.  No results found for: TSH Lab Results  Component Value Date   WBC 14.0 (H) 10/19/2017   HGB 14.0 10/19/2017   HCT 39.9 10/19/2017   MCV 89.9 10/19/2017   PLT 337 10/19/2017      Assessment & Plan:   Problem List Items Addressed This Visit    None    Visit Diagnoses    Encounter for surveillance of contraceptive pills    -  Primary Stable on current OCP Will refill    Relevant Medications   norgestimate-ethinyl  estradiol (SPRINTEC 28) 0.25-35 MG-MCG tablet   Other Relevant Orders   POCT urine pregnancy   Screen for STD (sexually transmitted disease)    - discussed screening options   Relevant Orders   Trichomonas vaginalis, RNA   GC/Chlamydia Probe Amp   Hepatitis B surface antigen   HIV Antibody (routine testing w rflx)   RPR   Generalized anxiety disorder    - discussed that she has GAD based on GAD7 Will screen for thyroid disease Pt to get [facility] evaluation for anxiety and comorbid attention  deficit   Relevant Orders   TSH   Ambulatory referral to Psychiatry   Panic attack       Inattention    -  Will evaluate for inattention and anxiety    Relevant Orders   Ambulatory referral to Psychiatry      Meds ordered this encounter  Medications  . norgestimate-ethinyl estradiol (SPRINTEC 28) 0.25-35 MG-MCG tablet    Sig: Take 1 tablet by mouth daily.    Dispense:  90 tablet    Refill:  0    Follow-up: No follow-ups on file.    Doristine BosworthZoe A Aurelius Gildersleeve, MD

## 2018-10-04 NOTE — Patient Instructions (Addendum)
   If you have lab work done today you will be contacted with your lab results within the next 2 weeks.  If you have not heard from us then please contact us. The fastest way to get your results is to register for My Chart.   IF you received an x-ray today, you will receive an invoice from [facility]. Please contact [facility] at [phone] with questions or concerns regarding your invoice.   IF you received labwork today, you will receive an invoice from [facility]. Please contact [facility] at [phone] with questions or concerns regarding your invoice.   Our billing staff will not be able to assist you with questions regarding bills from these companies.  You will be contacted with the lab results as soon as they are available. The fastest way to get your results is to activate your My Chart account. Instructions are located on the last page of this paperwork. If you have not heard from us regarding the results in 2 weeks, please contact this office.      Living With Anxiety  After being diagnosed with an anxiety disorder, you may be relieved to know why you have felt or behaved a certain way. It is natural to also feel overwhelmed about the treatment ahead and what it will mean for your life. With care and support, you can manage this condition and recover from it. How to cope with anxiety Dealing with stress Stress is your body's reaction to life changes and events, both good and bad. Stress can last just a few hours or it can be ongoing. Stress can play a major role in anxiety, so it is important to learn both how to cope with stress and how to think about it differently. Talk with your health care provider or a counselor to learn more about stress reduction. He or she may suggest some stress reduction techniques, such as:  Music therapy. This can include creating or listening to music that you enjoy and that inspires you.  Mindfulness-based meditation. This  involves being aware of your normal breaths, rather than trying to control your breathing. It can be done while sitting or walking.  Centering prayer. This is a kind of meditation that involves focusing on a word, phrase, or sacred image that is meaningful to you and that brings you peace.  Deep breathing. To do this, expand your stomach and inhale slowly through your nose. Hold your breath for 3-5 seconds. Then exhale slowly, allowing your stomach muscles to relax.  Self-talk. This is a skill where you identify thought patterns that lead to anxiety reactions and correct those thoughts.  Muscle relaxation. This involves tensing muscles then relaxing them. Choose a stress reduction technique that fits your lifestyle and personality. Stress reduction techniques take time and practice. Set aside 5-15 minutes a day to do them. Therapists can offer training in these techniques. The training may be covered by some insurance plans. Other things you can do to manage stress include:  Keeping a stress diary. This can help you learn what triggers your stress and ways to control your response.  Thinking about how you respond to certain situations. You may not be able to control everything, but you can control your reaction.  Making time for activities that help you relax, and not feeling guilty about spending your time in this way. Therapy combined with coping and stress-reduction skills provides the best chance for successful treatment. Medicines Medicines can help ease symptoms. Medicines for anxiety   include:  Anti-anxiety drugs.  Antidepressants.  Beta-blockers. Medicines may be used as the main treatment for anxiety disorder, along with therapy, or if other treatments are not working. Medicines should be prescribed by a health care provider. Relationships Relationships can play a big part in helping you recover. Try to spend more time connecting with trusted friends and family members. Consider  going to couples counseling, taking family education classes, or going to family therapy. Therapy can help you and others better understand the condition. How to recognize changes in your condition Everyone has a different response to treatment for anxiety. Recovery from anxiety happens when symptoms decrease and stop interfering with your daily activities at home or work. This may mean that you will start to:  Have better concentration and focus.  Sleep better.  Be less irritable.  Have more energy.  Have improved memory. It is important to recognize when your condition is getting worse. Contact your health care provider if your symptoms interfere with home or work and you do not feel like your condition is improving. Where to find help and support: You can get help and support from these sources:  Self-help groups.  Online and community organizations.  A trusted spiritual leader.  Couples counseling.  Family education classes.  Family therapy. Follow these instructions at home:  Eat a healthy diet that includes plenty of vegetables, fruits, whole grains, low-fat dairy products, and lean protein. Do not eat a lot of foods that are high in solid fats, added sugars, or salt.  Exercise. Most adults should do the following: ? Exercise for at least 150 minutes each week. The exercise should increase your heart rate and make you sweat (moderate-intensity exercise). ? Strengthening exercises at least twice a week.  Cut down on caffeine, tobacco, alcohol, and other potentially harmful substances.  Get the right amount and quality of sleep. Most adults need 7-9 hours of sleep each night.  Make choices that simplify your life.  Take over-the-counter and prescription medicines only as told by your health care provider.  Avoid caffeine, alcohol, and certain over-the-counter cold medicines. These may make you feel worse. Ask your pharmacist which medicines to avoid.  Keep all  follow-up visits as told by your health care provider. This is important. Questions to ask your health care provider  Would I benefit from therapy?  How often should I follow up with a health care provider?  How long do I need to take medicine?  Are there any long-term side effects of my medicine?  Are there any alternatives to taking medicine? Contact a health care provider if:  You have a hard time staying focused or finishing daily tasks.  You spend many hours a day feeling worried about everyday life.  You become exhausted by worry.  You start to have headaches, feel tense, or have nausea.  You urinate more than normal.  You have diarrhea. Get help right away if:  You have a racing heart and shortness of breath.  You have thoughts of hurting yourself or others. If you ever feel like you may hurt yourself or others, or have thoughts about taking your own life, get help right away. You can go to your nearest emergency department or call:  Your local emergency services (911 in the [place]  A suicide crisis helpline, such as the National Suicide Prevention Lifeline at 1-800-273-8255. This is open 24-hours a day. Summary  Taking steps to deal with stress can help calm you.  Medicines cannot cure   anxiety disorders, but they can help ease symptoms.  Family, friends, and partners can play a big part in helping you recover from an anxiety disorder. This information is not intended to replace advice given to you by your health care provider. Make sure you discuss any questions you have with your health care provider. Document Released: 08/15/2016 Document Revised: 08/15/2016 Document Reviewed: 08/15/2016 Elsevier Interactive Patient Education  2019 Elsevier Inc.  

## 2018-10-05 LAB — HIV ANTIBODY (ROUTINE TESTING W REFLEX): HIV Screen 4th Generation wRfx: NONREACTIVE

## 2018-10-05 LAB — TSH: TSH: 1.36 u[IU]/mL (ref 0.450–4.500)

## 2018-10-05 LAB — RPR: RPR: NONREACTIVE

## 2018-10-05 LAB — HEPATITIS B SURFACE ANTIGEN: Hepatitis B Surface Ag: NEGATIVE

## 2018-10-09 ENCOUNTER — Encounter: Payer: Self-pay | Admitting: Radiology

## 2018-10-09 LAB — GC/CHLAMYDIA PROBE AMP
Chlamydia trachomatis, NAA: NEGATIVE
Neisseria gonorrhoeae by PCR: NEGATIVE

## 2018-10-09 LAB — TRICHOMONAS VAGINALIS, PROBE AMP: Trich vag by NAA: NEGATIVE

## 2019-01-02 ENCOUNTER — Encounter: Payer: BC Managed Care – PPO | Admitting: Family Medicine

## 2019-01-08 ENCOUNTER — Ambulatory Visit: Payer: BC Managed Care – PPO | Admitting: Psychology

## 2019-01-08 ENCOUNTER — Ambulatory Visit: Payer: Self-pay | Admitting: Psychology

## 2019-01-14 ENCOUNTER — Ambulatory Visit (INDEPENDENT_AMBULATORY_CARE_PROVIDER_SITE_OTHER): Payer: BC Managed Care – PPO | Admitting: Psychology

## 2019-01-14 DIAGNOSIS — F419 Anxiety disorder, unspecified: Secondary | ICD-10-CM

## 2019-01-14 DIAGNOSIS — F900 Attention-deficit hyperactivity disorder, predominantly inattentive type: Secondary | ICD-10-CM

## 2019-01-22 ENCOUNTER — Ambulatory Visit (INDEPENDENT_AMBULATORY_CARE_PROVIDER_SITE_OTHER): Payer: BC Managed Care – PPO | Admitting: Psychology

## 2019-01-22 DIAGNOSIS — F900 Attention-deficit hyperactivity disorder, predominantly inattentive type: Secondary | ICD-10-CM

## 2019-01-22 DIAGNOSIS — F419 Anxiety disorder, unspecified: Secondary | ICD-10-CM | POA: Diagnosis not present

## 2019-01-29 NOTE — Progress Notes (Signed)
This encounter was created in error - please disregard.

## 2019-02-14 ENCOUNTER — Emergency Department (HOSPITAL_COMMUNITY): Payer: BC Managed Care – PPO

## 2019-02-14 ENCOUNTER — Ambulatory Visit: Payer: Self-pay | Admitting: *Deleted

## 2019-02-14 ENCOUNTER — Encounter (HOSPITAL_COMMUNITY)
Admission: EM | Disposition: A | Discharge status: Home / Self Care | Payer: Self-pay | Source: Home / Self Care | Attending: Emergency Medicine

## 2019-02-14 ENCOUNTER — Emergency Department (HOSPITAL_COMMUNITY): Payer: BC Managed Care – PPO | Admitting: Certified Registered"

## 2019-02-14 ENCOUNTER — Other Ambulatory Visit: Payer: Self-pay

## 2019-02-14 ENCOUNTER — Encounter (HOSPITAL_COMMUNITY): Payer: Self-pay | Admitting: Emergency Medicine

## 2019-02-14 ENCOUNTER — Observation Stay (HOSPITAL_COMMUNITY)
Admission: EM | Admit: 2019-02-14 | Discharge: 2019-02-15 | Disposition: A | Discharge status: Home / Self Care | Payer: BC Managed Care – PPO | Attending: General Surgery | Admitting: General Surgery

## 2019-02-14 DIAGNOSIS — Z793 Long term (current) use of hormonal contraceptives: Secondary | ICD-10-CM | POA: Diagnosis not present

## 2019-02-14 DIAGNOSIS — Z1159 Encounter for screening for other viral diseases: Secondary | ICD-10-CM | POA: Insufficient documentation

## 2019-02-14 DIAGNOSIS — K3580 Unspecified acute appendicitis: Secondary | ICD-10-CM | POA: Diagnosis not present

## 2019-02-14 DIAGNOSIS — R1031 Right lower quadrant pain: Secondary | ICD-10-CM

## 2019-02-14 DIAGNOSIS — Z79899 Other long term (current) drug therapy: Secondary | ICD-10-CM | POA: Diagnosis not present

## 2019-02-14 DIAGNOSIS — Z9049 Acquired absence of other specified parts of digestive tract: Secondary | ICD-10-CM

## 2019-02-14 HISTORY — PX: LAPAROSCOPIC APPENDECTOMY: SHX408

## 2019-02-14 LAB — COMPREHENSIVE METABOLIC PANEL
ALT: 17 U/L (ref 0–44)
AST: 21 U/L (ref 15–41)
Albumin: 4.2 g/dL (ref 3.5–5.0)
Alkaline Phosphatase: 56 U/L (ref 38–126)
Anion gap: 7 (ref 5–15)
BUN: 13 mg/dL (ref 6–20)
CO2: 28 mmol/L (ref 22–32)
Calcium: 9.5 mg/dL (ref 8.9–10.3)
Chloride: 103 mmol/L (ref 98–111)
Creatinine, Ser: 0.63 mg/dL (ref 0.44–1.00)
GFR calc Af Amer: >60 mL/min (ref >60)
GFR calc non Af Amer: >60 mL/min (ref >60)
Glucose, Bld: 89 mg/dL (ref 70–99)
Potassium: 3.9 mmol/L (ref 3.5–5.1)
Sodium: 138 mmol/L (ref 135–145)
Total Bilirubin: 1 mg/dL (ref 0.3–1.2)
Total Protein: 7.3 g/dL (ref 6.5–8.1)

## 2019-02-14 LAB — WET PREP, GENITAL
Clue Cells Wet Prep HPF POC: NONE SEEN
Sperm: NONE SEEN
Trich, Wet Prep: NONE SEEN
Yeast Wet Prep HPF POC: NONE SEEN

## 2019-02-14 LAB — URINALYSIS, ROUTINE W REFLEX MICROSCOPIC
Bilirubin Urine: NEGATIVE
Glucose, UA: NEGATIVE mg/dL
Ketones, ur: NEGATIVE mg/dL
Leukocytes,Ua: NEGATIVE
Nitrite: NEGATIVE
Protein, ur: 30 mg/dL — AB
Specific Gravity, Urine: 1.03 (ref 1.005–1.030)
pH: 5 (ref 5.0–8.0)

## 2019-02-14 LAB — CBC
HCT: 40.7 % (ref 36.0–46.0)
Hemoglobin: 13.6 g/dL (ref 12.0–15.0)
MCH: 30.8 pg (ref 26.0–34.0)
MCHC: 33.4 g/dL (ref 30.0–36.0)
MCV: 92.1 fL (ref 80.0–100.0)
Platelets: 330 10*3/uL (ref 150–400)
RBC: 4.42 MIL/uL (ref 3.87–5.11)
RDW: 12.3 % (ref 11.5–15.5)
WBC: 17 10*3/uL — ABNORMAL HIGH (ref 4.0–10.5)
nRBC: 0 % (ref 0.0–0.2)

## 2019-02-14 LAB — I-STAT BETA HCG BLOOD, ED (MC, WL, AP ONLY): I-stat hCG, quantitative: <5 m[IU]/mL (ref <5)

## 2019-02-14 LAB — SARS CORONAVIRUS 2 BY RT PCR (HOSPITAL ORDER, PERFORMED IN ~~LOC~~ HOSPITAL LAB): SARS Coronavirus 2: NEGATIVE

## 2019-02-14 LAB — LIPASE, BLOOD: Lipase: 26 U/L (ref 11–51)

## 2019-02-14 SURGERY — APPENDECTOMY, LAPAROSCOPIC
Anesthesia: General | Site: Abdomen

## 2019-02-14 MED ORDER — HYDROMORPHONE HCL 1 MG/ML IJ SOLN
INTRAMUSCULAR | Status: AC
  Filled 2019-02-14: qty 1

## 2019-02-14 MED ORDER — DIPHENHYDRAMINE HCL 25 MG PO CAPS: 25.0000 mg | ORAL_CAPSULE | Freq: Four times a day (QID) | ORAL | Status: DC | PRN

## 2019-02-14 MED ORDER — SUGAMMADEX SODIUM 200 MG/2ML IV SOLN
INTRAVENOUS | Status: DC | PRN
  Administered 2019-02-14: 200 mg via INTRAVENOUS

## 2019-02-14 MED ORDER — MIDAZOLAM HCL 2 MG/2ML IJ SOLN
INTRAMUSCULAR | Status: AC
  Filled 2019-02-14: qty 2

## 2019-02-14 MED ORDER — MORPHINE SULFATE (PF) 4 MG/ML IV SOLN
4.0000 mg | INTRAVENOUS | Status: DC | PRN
  Administered 2019-02-15: 4 mg via INTRAVENOUS
  Filled 2019-02-14: qty 1

## 2019-02-14 MED ORDER — KCL IN DEXTROSE-NACL 20-5-0.45 MEQ/L-%-% IV SOLN
INTRAVENOUS | Status: DC
  Administered 2019-02-14: 23:00:00 via INTRAVENOUS

## 2019-02-14 MED ORDER — ONDANSETRON HCL 4 MG/2ML IJ SOLN: 4.0000 mg | Freq: Four times a day (QID) | INTRAMUSCULAR | Status: DC | PRN

## 2019-02-14 MED ORDER — ONDANSETRON 4 MG PO TBDP: 4.0000 mg | ORAL_TABLET | Freq: Four times a day (QID) | ORAL | Status: DC | PRN

## 2019-02-14 MED ORDER — LIDOCAINE 2% (20 MG/ML) 5 ML SYRINGE
INTRAMUSCULAR | Status: AC
  Filled 2019-02-14: qty 5

## 2019-02-14 MED ORDER — SUGAMMADEX SODIUM 500 MG/5ML IV SOLN
INTRAVENOUS | Status: AC
  Filled 2019-02-14: qty 5

## 2019-02-14 MED ORDER — METRONIDAZOLE IN NACL 5-0.79 MG/ML-% IV SOLN
500.0000 mg | Freq: Once | INTRAVENOUS | Status: AC
  Administered 2019-02-14: 500 mg via INTRAVENOUS
  Filled 2019-02-14: qty 100

## 2019-02-14 MED ORDER — SODIUM CHLORIDE 0.9% FLUSH: 3.0000 mL | Freq: Once | INTRAVENOUS | Status: DC

## 2019-02-14 MED ORDER — DIPHENHYDRAMINE HCL 50 MG/ML IJ SOLN: 25.0000 mg | Freq: Four times a day (QID) | INTRAMUSCULAR | Status: DC | PRN

## 2019-02-14 MED ORDER — ROCURONIUM BROMIDE 10 MG/ML (PF) SYRINGE
PREFILLED_SYRINGE | INTRAVENOUS | Status: AC
  Filled 2019-02-14: qty 10

## 2019-02-14 MED ORDER — FENTANYL CITRATE (PF) 100 MCG/2ML IJ SOLN
INTRAMUSCULAR | Status: DC | PRN
  Administered 2019-02-14: 50 ug via INTRAVENOUS
  Administered 2019-02-14: 100 ug via INTRAVENOUS

## 2019-02-14 MED ORDER — BUPIVACAINE-EPINEPHRINE (PF) 0.25% -1:200000 IJ SOLN
INTRAMUSCULAR | Status: AC
  Filled 2019-02-14: qty 30

## 2019-02-14 MED ORDER — 0.9 % SODIUM CHLORIDE (POUR BTL) OPTIME
TOPICAL | Status: DC | PRN
  Administered 2019-02-14: 1000 mL

## 2019-02-14 MED ORDER — ROCURONIUM BROMIDE 50 MG/5ML IV SOSY
PREFILLED_SYRINGE | INTRAVENOUS | Status: DC | PRN
  Administered 2019-02-14: 40 mg via INTRAVENOUS

## 2019-02-14 MED ORDER — METHOCARBAMOL 500 MG PO TABS: 500.0000 mg | ORAL_TABLET | Freq: Four times a day (QID) | ORAL | Status: DC | PRN

## 2019-02-14 MED ORDER — LACTATED RINGERS IV SOLN
INTRAVENOUS | Status: DC | PRN
  Administered 2019-02-14 (×2): via INTRAVENOUS

## 2019-02-14 MED ORDER — CELECOXIB 200 MG PO CAPS
200.0000 mg | ORAL_CAPSULE | Freq: Two times a day (BID) | ORAL | Status: DC
  Administered 2019-02-15 (×2): 200 mg via ORAL
  Filled 2019-02-14 (×2): qty 1

## 2019-02-14 MED ORDER — SODIUM CHLORIDE 0.9 % IR SOLN
Status: DC | PRN
  Administered 2019-02-14: 1000 mL

## 2019-02-14 MED ORDER — OXYCODONE HCL 5 MG PO TABS: 5.0000 mg | ORAL_TABLET | ORAL | Status: DC | PRN

## 2019-02-14 MED ORDER — SUCCINYLCHOLINE CHLORIDE 200 MG/10ML IV SOSY
PREFILLED_SYRINGE | INTRAVENOUS | Status: AC
  Filled 2019-02-14: qty 10

## 2019-02-14 MED ORDER — BUPIVACAINE-EPINEPHRINE 0.25% -1:200000 IJ SOLN
INTRAMUSCULAR | Status: DC | PRN
  Administered 2019-02-14: 10 mL

## 2019-02-14 MED ORDER — PROPOFOL 10 MG/ML IV BOLUS
INTRAVENOUS | Status: AC
  Filled 2019-02-14: qty 20

## 2019-02-14 MED ORDER — KETOROLAC TROMETHAMINE 30 MG/ML IJ SOLN
INTRAMUSCULAR | Status: DC | PRN
  Administered 2019-02-14: 30 mg via INTRAVENOUS

## 2019-02-14 MED ORDER — MIDAZOLAM HCL 5 MG/5ML IJ SOLN
INTRAMUSCULAR | Status: DC | PRN
  Administered 2019-02-14: 2 mg via INTRAVENOUS

## 2019-02-14 MED ORDER — KCL IN DEXTROSE-NACL 20-5-0.45 MEQ/L-%-% IV SOLN
INTRAVENOUS | Status: AC
  Filled 2019-02-14: qty 1000

## 2019-02-14 MED ORDER — PROPOFOL 10 MG/ML IV BOLUS
INTRAVENOUS | Status: DC | PRN
  Administered 2019-02-14: 120 mg via INTRAVENOUS

## 2019-02-14 MED ORDER — DEXAMETHASONE SODIUM PHOSPHATE 10 MG/ML IJ SOLN
INTRAMUSCULAR | Status: DC | PRN
  Administered 2019-02-14: 10 mg via INTRAVENOUS

## 2019-02-14 MED ORDER — GABAPENTIN 300 MG PO CAPS
300.0000 mg | ORAL_CAPSULE | Freq: Two times a day (BID) | ORAL | Status: DC
  Administered 2019-02-15 (×2): 300 mg via ORAL
  Filled 2019-02-14 (×2): qty 1

## 2019-02-14 MED ORDER — SODIUM CHLORIDE 0.9 % IV BOLUS
1000.0000 mL | Freq: Once | INTRAVENOUS | Status: AC
  Administered 2019-02-14: 1000 mL via INTRAVENOUS

## 2019-02-14 MED ORDER — ONDANSETRON HCL 4 MG/2ML IJ SOLN
INTRAMUSCULAR | Status: AC
  Filled 2019-02-14: qty 2

## 2019-02-14 MED ORDER — IOHEXOL 300 MG/ML  SOLN
100.0000 mL | Freq: Once | INTRAMUSCULAR | Status: AC | PRN
  Administered 2019-02-14: 100 mL via INTRAVENOUS

## 2019-02-14 MED ORDER — FENTANYL CITRATE (PF) 250 MCG/5ML IJ SOLN
INTRAMUSCULAR | Status: AC
  Filled 2019-02-14: qty 5

## 2019-02-14 MED ORDER — SODIUM CHLORIDE 0.9 % IV SOLN
2.0000 g | Freq: Once | INTRAVENOUS | Status: AC
  Administered 2019-02-14: 2 g via INTRAVENOUS
  Filled 2019-02-14: qty 20

## 2019-02-14 MED ORDER — ONDANSETRON HCL 4 MG/2ML IJ SOLN: 4.0000 mg | Freq: Once | INTRAMUSCULAR | Status: DC

## 2019-02-14 MED ORDER — LIDOCAINE 2% (20 MG/ML) 5 ML SYRINGE
INTRAMUSCULAR | Status: DC | PRN
  Administered 2019-02-14: 60 mg via INTRAVENOUS

## 2019-02-14 MED ORDER — ENOXAPARIN SODIUM 40 MG/0.4ML ~~LOC~~ SOLN
40.0000 mg | SUBCUTANEOUS | Status: DC
  Filled 2019-02-14: qty 0.4

## 2019-02-14 MED ORDER — HYDROMORPHONE HCL 1 MG/ML IJ SOLN
0.2500 mg | INTRAMUSCULAR | Status: DC | PRN
  Administered 2019-02-14 (×4): 0.5 mg via INTRAVENOUS

## 2019-02-14 MED ORDER — SUCCINYLCHOLINE CHLORIDE 200 MG/10ML IV SOSY
PREFILLED_SYRINGE | INTRAVENOUS | Status: DC | PRN
  Administered 2019-02-14: 80 mg via INTRAVENOUS

## 2019-02-14 MED ORDER — ONDANSETRON HCL 4 MG/2ML IJ SOLN
INTRAMUSCULAR | Status: DC | PRN
  Administered 2019-02-14: 4 mg via INTRAVENOUS

## 2019-02-14 SURGICAL SUPPLY — 39 items
BLADE CLIPPER SURG (BLADE) IMPLANT
CANISTER SUCT 3000ML PPV (MISCELLANEOUS) ×3 IMPLANT
CHLORAPREP W/TINT 26 (MISCELLANEOUS) ×3 IMPLANT
COVER SURGICAL LIGHT HANDLE (MISCELLANEOUS) ×3 IMPLANT
COVER WAND RF STERILE (DRAPES) ×3 IMPLANT
CUTTER FLEX LINEAR 45M (STAPLE) ×3 IMPLANT
DERMABOND ADVANCED (GAUZE/BANDAGES/DRESSINGS) ×2
DERMABOND ADVANCED .7 DNX12 (GAUZE/BANDAGES/DRESSINGS) ×1 IMPLANT
ELECT REM PT RETURN 9FT ADLT (ELECTROSURGICAL) ×3
ELECTRODE REM PT RTRN 9FT ADLT (ELECTROSURGICAL) ×1 IMPLANT
GLOVE BIO SURGEON STRL SZ8 (GLOVE) ×3 IMPLANT
GLOVE BIOGEL PI IND STRL 8 (GLOVE) ×1 IMPLANT
GLOVE BIOGEL PI INDICATOR 8 (GLOVE) ×2
GOWN STRL REUS W/ TWL LRG LVL3 (GOWN DISPOSABLE) ×2 IMPLANT
GOWN STRL REUS W/ TWL XL LVL3 (GOWN DISPOSABLE) ×1 IMPLANT
GOWN STRL REUS W/TWL LRG LVL3 (GOWN DISPOSABLE) ×4
GOWN STRL REUS W/TWL XL LVL3 (GOWN DISPOSABLE) ×2
KIT BASIN OR (CUSTOM PROCEDURE TRAY) ×3 IMPLANT
KIT TURNOVER KIT B (KITS) ×3 IMPLANT
NEEDLE 22X1 1/2 (OR ONLY) (NEEDLE) ×3 IMPLANT
NS IRRIG 1000ML POUR BTL (IV SOLUTION) ×3 IMPLANT
PAD ARMBOARD 7.5X6 YLW CONV (MISCELLANEOUS) ×6 IMPLANT
POUCH RETRIEVAL ECOSAC 10 (ENDOMECHANICALS) ×1 IMPLANT
POUCH RETRIEVAL ECOSAC 10MM (ENDOMECHANICALS) ×2
RELOAD 45 VASCULAR/THIN (ENDOMECHANICALS) ×3 IMPLANT
SCISSORS LAP 5X35 DISP (ENDOMECHANICALS) ×3 IMPLANT
SET IRRIG TUBING LAPAROSCOPIC (IRRIGATION / IRRIGATOR) ×3 IMPLANT
SET TUBE SMOKE EVAC HIGH FLOW (TUBING) ×3 IMPLANT
SHEARS HARMONIC ACE PLUS 36CM (ENDOMECHANICALS) ×3 IMPLANT
SPECIMEN JAR SMALL (MISCELLANEOUS) ×3 IMPLANT
SUT VIC AB 4-0 PS2 27 (SUTURE) ×6 IMPLANT
TOWEL OR 17X24 6PK STRL BLUE (TOWEL DISPOSABLE) ×3 IMPLANT
TOWEL OR 17X26 10 PK STRL BLUE (TOWEL DISPOSABLE) ×3 IMPLANT
TRAY FOLEY CATH 16FR SILVER (SET/KITS/TRAYS/PACK) ×3 IMPLANT
TRAY LAPAROSCOPIC MC (CUSTOM PROCEDURE TRAY) ×3 IMPLANT
TROCAR XCEL 12X100 BLDLESS (ENDOMECHANICALS) ×3 IMPLANT
TROCAR XCEL BLUNT TIP 100MML (ENDOMECHANICALS) ×3 IMPLANT
TROCAR XCEL NON-BLD 5MMX100MML (ENDOMECHANICALS) ×3 IMPLANT
WATER STERILE IRR 1000ML POUR (IV SOLUTION) ×3 IMPLANT

## 2019-02-14 NOTE — Anesthesia Postprocedure Evaluation (Signed)
Anesthesia Post Note  Patient: Diana Farrell  Procedure(s) Performed: APPENDECTOMY LAPAROSCOPIC (N/A Abdomen)     Patient location during evaluation: PACU Anesthesia Type: General Level of consciousness: awake and alert Pain management: pain level controlled Vital Signs Assessment: post-procedure vital signs reviewed and stable Respiratory status: spontaneous breathing, nonlabored ventilation, respiratory function stable and patient connected to nasal cannula oxygen Cardiovascular status: blood pressure returned to baseline and stable Postop Assessment: no apparent nausea or vomiting Anesthetic complications: no    Last Vitals:  Vitals:   02/14/19 2300 02/14/19 2315  BP: (!) 90/57 93/66  Pulse: 68 79  Resp: 17 20  Temp:  36.8 C  SpO2: 95% 94%    Last Pain:  Vitals:   02/14/19 2300  TempSrc:   PainSc: Asleep                 Deamber Buckhalter,W. EDMOND

## 2019-02-14 NOTE — Transfer of Care (Signed)
Immediate Anesthesia Transfer of Care Note  Patient: Diana Farrell  Procedure(s) Performed: APPENDECTOMY LAPAROSCOPIC (N/A Abdomen)  Patient Location: PACU  Anesthesia Type:General  Level of Consciousness: awake, alert  and oriented  Airway & Oxygen Therapy: Patient Spontanous Breathing  Post-op Assessment: Report given to RN and Post -op Vital signs reviewed and stable  Post vital signs: Reviewed and stable  Last Vitals:  Vitals Value Taken Time  BP 123/63 02/14/19 2220  Temp 36.6 C 02/14/19 2215  Pulse 108 02/14/19 2222  Resp 21 02/14/19 2222  SpO2 95 % 02/14/19 2222  Vitals shown include unvalidated device data.  Last Pain:  Vitals:   02/14/19 1758  TempSrc:   PainSc: 2          Complications: No apparent anesthesia complications

## 2019-02-14 NOTE — ED Notes (Signed)
No abd pain unless she moves  Talking on the phone  Taking her bp cuff edv

## 2019-02-14 NOTE — Anesthesia Preprocedure Evaluation (Addendum)
Anesthesia Evaluation  Patient identified by MRN, date of birth, ID band Patient awake    Reviewed: Allergy & Precautions, H&P , NPO status , Patient's Chart, lab work & pertinent test results  Airway Mallampati: II  TM Distance: >3 FB Neck ROM: Full    Dental no notable dental hx. (+) Teeth Intact, Dental Advisory Given   Pulmonary neg pulmonary ROS,    Pulmonary exam normal breath sounds clear to auscultation       Cardiovascular negative cardio ROS   Rhythm:Regular Rate:Normal     Neuro/Psych negative neurological ROS  negative psych ROS   GI/Hepatic negative GI ROS, Neg liver ROS,   Endo/Other  negative endocrine ROS  Renal/GU negative Renal ROS  negative genitourinary   Musculoskeletal   Abdominal   Peds  Hematology negative hematology ROS (+)   Anesthesia Other Findings   Reproductive/Obstetrics negative OB ROS                            Anesthesia Physical Anesthesia Plan  ASA: I  Anesthesia Plan: General   Post-op Pain Management:    Induction: Intravenous, Rapid sequence and Cricoid pressure planned  PONV Risk Score and Plan: 4 or greater and Ondansetron, Dexamethasone and Midazolam  Airway Management Planned: Oral ETT  Additional Equipment:   Intra-op Plan:   Post-operative Plan: Extubation in OR  Informed Consent: I have reviewed the patients History and Physical, chart, labs and discussed the procedure including the risks, benefits and alternatives for the proposed anesthesia with the patient or authorized representative who has indicated his/her understanding and acceptance.     Dental advisory given  Plan Discussed with: CRNA  Anesthesia Plan Comments:         Anesthesia Quick Evaluation  

## 2019-02-14 NOTE — ED Notes (Signed)
The pt is calling and talking to her family continuously

## 2019-02-14 NOTE — Op Note (Signed)
02/14/2019  10:04 PM  PATIENT:  Diana Farrell  23 y.o. female  PRE-OPERATIVE DIAGNOSIS:  APPENDICITIS  POST-OPERATIVE DIAGNOSIS:  APPENDICITIS  PROCEDURE:  Procedure(s): APPENDECTOMY LAPAROSCOPIC  SURGEON:  Surgeon(s): Georganna Skeans, MD  ASSISTANTS: Neomia Glass, RNFA-S   ANESTHESIA:   local and general  EBL:  Total I/O In: 1000 [I.V.:1000] Out: -   BLOOD ADMINISTERED:none  DRAINS: none   SPECIMEN:  Excision  DISPOSITION OF SPECIMEN:  PATHOLOGY  COUNTS:  YES  DICTATION: .Dragon Dictation Findings: Acute appendicitis without perforation  Procedure in detail: Informed consent was obtained.  COVID testing was negative.  She received intravenous antibiotics.  She was brought to the operating room and general endotracheal anesthesia was administered by the anesthesia staff.  Her abdomen was prepped and draped in sterile fashion.  Timeout procedure was performed.The infraumbilical region was infiltrated with local. Infraumbilical incision was made. Subcutaneous tissues were dissected down revealing the anterior fascia. This was divided sharply along the midline. Peritoneal cavity was entered under direct vision without complication. A 0 Vicryl pursestring was placed around the fascial opening. Hassan trocar was inserted into the abdomen. The abdomen was insufflated with carbon dioxide in standard fashion. Under direct vision a 12 mm left lower quadrant and 5 mm right abdominal port were placed.  Local was used at each port site.  Laparoscopic exploration revealed a very inflamed but not perforated appendix.  The mesoappendix was divided with the harmonic scalpel achieving excellent hemostasis.  The base of the appendix was divided with Endo GIA with a vascular load.  There was excellent staple line.  The appendix was placed in a bag and removed from the abdomen.  It was sent to pathology.  Hemostasis was ensured.  The abdomen was copiously irrigated.  The staple line was  rechecked and it was intact.  Ports were removed under direct vision.  Pneumoperitoneum was released.  Infraumbilical fascia was closed by tying the pursestring.  All 3 wounds were irrigated and the skin of each was closed with 4-0 Vicryl subcuticular followed by Dermabond.  All counts were correct.  She tolerated procedure well without apparent complication was taken recovery in stable condition.  PATIENT DISPOSITION:  PACU - hemodynamically stable.   Delay start of Pharmacological VTE agent (>24hrs) due to surgical blood loss or risk of bleeding:  no  Georganna Skeans, MD, MPH, FACS Pager: [phone]  6/12/202010:04 PM

## 2019-02-14 NOTE — ED Notes (Signed)
Still on her phone   Iv detached in c-t reconnected  Minimal pain

## 2019-02-14 NOTE — ED Notes (Signed)
Dr thompson at the bedside 

## 2019-02-14 NOTE — ED Provider Notes (Signed)
[facility]   CSN: [id number] Arrival date & time: 02/14/19  1309    History   Chief Complaint Chief Complaint  Patient presents with   Abdominal Pain   sent by UC    HPI Diana Farrell is a 23 y.o. female with a PMH of SVD and anemia presenting with constant RLQ sharp non radiating abdominal pain onset last night. Patient reports she was evaluated by UC and advised to come to the ER. Patient reports nothing makes pain better or worse. Patient states she has tried ibuprofen and pepto bismol without relief. Patient reports nausea, but denies vomiting. Patient reports 1 episode of brown diarrhea yesterday. Patient reports she is sexually active with one partner and does not use protection. Patient states she has had Chlamydia in the past and received treatment. Patient denies any abdominal surgeries. Patient denies fever, chills, cough, congestion, shortness of breath, chest pain, sick contacts, or recent travel. Patient denies dysuria, hematuria, flank pain, vaginal bleeding, or vaginal discharge. Patient states she has not been taking her birth control pills over the last 2 months. Patient reports she last ate at 11am and she had a biscuit.      HPI  Past Medical History:  Diagnosis Date   Anemia, antepartum(648.23)    Medical history non-contributory    Other abscess of vulva    SVD (spontaneous vaginal delivery) 08/05/2013    There are no active problems to display for this patient.   Past Surgical History:  Procedure Laterality Date   WISDOM TOOTH EXTRACTION       OB History    Gravida  2   Para  1   Term  1   Preterm      AB      Living  1     SAB      TAB      Ectopic      Multiple      Live Births  1            Home Medications    Prior to Admission medications   Medication Sig Start Date End Date Taking? Authorizing Provider  amphetamine-dextroamphetamine (ADDERALL) 20 MG tablet  Take 20 mg by mouth daily.    [provider]  norgestimate-ethinyl estradiol (SPRINTEC 28) 0.25-35 MG-MCG tablet Take 1 tablet by mouth daily. 10/04/18   Doristine BosworthStallings, Zoe A, MD    Family History Family History  Adopted: Yes  Family history unknown: Yes    Social History Social History   Tobacco Use   Smoking status: Never Smoker   Smokeless tobacco: Never Used  Substance Use Topics   Alcohol use: No   Drug use: No     Allergies   Patient has no known allergies.   Review of Systems Review of Systems  Constitutional: Negative for activity change, appetite change, chills, fever and unexpected weight change.  HENT: Negative for congestion, rhinorrhea and sore throat.   Eyes: Negative for visual disturbance.  Respiratory: Negative for cough and shortness of breath.   Cardiovascular: Negative for chest pain.  Gastrointestinal: Positive for abdominal pain and nausea. Negative for constipation, diarrhea and vomiting.  Endocrine: Negative for polydipsia, polyphagia and polyuria.  Genitourinary: Negative for dysuria, flank pain, frequency, vaginal bleeding and vaginal discharge.  Musculoskeletal: Negative for back pain.  Skin: Negative for rash.  Allergic/Immunologic: Negative for immunocompromised state.  Neurological: Negative for dizziness, weakness and light-headedness.  Psychiatric/Behavioral: The patient is not nervous/anxious.  Physical Exam Updated Vital Signs BP 107/80    Pulse (!) 55    Temp 98.8 F (37.1 C) (Oral)    Resp 18    Wt 54.3 kg    SpO2 100%    BMI 24.18 kg/m   Physical Exam Vitals signs and nursing note reviewed. Exam conducted with a chaperone present.  Constitutional:      General: She is not in acute distress.    Appearance: She is well-developed. She is not diaphoretic.  HENT:     Head: Normocephalic and atraumatic.     Mouth/Throat:     Mouth: Mucous membranes are moist.     Pharynx: No pharyngeal swelling or oropharyngeal  exudate.  Eyes:     Extraocular Movements: Extraocular movements intact.  Neck:     Musculoskeletal: Normal range of motion and neck supple.  Cardiovascular:     Rate and Rhythm: Normal rate and regular rhythm.     Heart sounds: Normal heart sounds. No murmur. No friction rub. No gallop.   Pulmonary:     Effort: Pulmonary effort is normal. No respiratory distress.     Breath sounds: Normal breath sounds. No wheezing or rales.  Abdominal:     General: Abdomen is flat. Bowel sounds are normal. There is no distension.     Palpations: Abdomen is soft. Abdomen is not rigid. There is no mass.     Tenderness: There is abdominal tenderness in the right lower quadrant. There is no right CVA tenderness, left CVA tenderness, guarding or rebound. Positive signs include Rovsing's sign and McBurney's sign. Negative signs include Murphy's sign.     Hernia: No hernia is present.  Genitourinary:    Vagina: No signs of injury and foreign body. Vaginal discharge (Thin white vaginal discharge present on exam.) present. No erythema.     Cervix: Discharge present. No cervical motion tenderness.     Uterus: Normal.      Adnexa: Right adnexa normal and left adnexa normal.  Musculoskeletal: Normal range of motion.  Skin:    General: Skin is warm.     Findings: No rash.  Neurological:     Mental Status: She is alert and oriented to person, place, and time.      ED Treatments / Results  Labs (all labs ordered are listed, but only abnormal results are displayed) Labs Reviewed  WET PREP, GENITAL - Abnormal; Notable for the following components:      Result Value   WBC, Wet Prep HPF POC MANY (*)    All other components within normal limits  CBC - Abnormal; Notable for the following components:   WBC 17.0 (*)    All other components within normal limits  URINALYSIS, ROUTINE W REFLEX MICROSCOPIC - Abnormal; Notable for the following components:   APPearance HAZY (*)    Hgb urine dipstick SMALL (*)     Protein, ur 30 (*)    Bacteria, UA RARE (*)    All other components within normal limits  SARS CORONAVIRUS 2 (HOSPITAL ORDER, PERFORMED IN [facility])  LIPASE, BLOOD  COMPREHENSIVE METABOLIC PANEL  I-STAT BETA HCG BLOOD, ED ([facility], [facility], [facility] ONLY)  GC/CHLAMYDIA PROBE AMP ([place]) NOT [facility]    EKG None  Radiology Ct Abdomen Pelvis W Contrast  Result Date: 02/14/2019 CLINICAL DATA:  Abdominal pain.  Appendicitis suspected. EXAM: CT ABDOMEN AND PELVIS WITH CONTRAST TECHNIQUE: Multidetector CT imaging of the abdomen and pelvis was performed using the standard protocol following bolus  administration of intravenous contrast. CONTRAST:  100mL OMNIPAQUE IOHEXOL 300 MG/ML  SOLN COMPARISON:  None. FINDINGS: Lower chest: Clear lung bases.  Heart normal in size. Hepatobiliary: No focal liver abnormality is seen. No gallstones, gallbladder wall thickening, or biliary dilatation. Pancreas: Unremarkable. No pancreatic ductal dilatation or surrounding inflammatory changes. Spleen: Normal in size without focal abnormality. Adrenals/Urinary Tract: No adrenal masses. Kidneys normal size, orientation and position with symmetric enhancement. 3 mm nonobstructing stone in the upper pole of the left kidney. No other stones, no masses and no hydronephrosis. Normal ureters. Normal bladder. Stomach/Bowel: Appendix is dilated to 9 mm with a thickened enhancing wall. There is mild inflammatory haziness in the fat adjacent to the appendiceal tip. No extraluminal air or adjacent fluid collection. Stomach, small bowel and colon are unremarkable. Vascular/Lymphatic: No significant vascular findings are present. No enlarged abdominal or pelvic lymph nodes. Reproductive: Uterus and bilateral adnexa are unremarkable. Other: No abdominal wall hernia or abnormality. No abdominopelvic ascites. Musculoskeletal: No acute or significant osseous findings. IMPRESSION: 1. Acute appendicitis. No evidence of appendiceal rupture or  of a periappendiceal abscess. 2. No other abnormalities. Electronically Signed   By: Amie Portlandavid  Ormond M.D.   On: 02/14/2019 18:04    Procedures Procedures (including critical care time)  Medications Ordered in ED Medications  sodium chloride flush (NS) 0.9 % injection 3 mL (has no administration in time range)  ondansetron (ZOFRAN) injection 4 mg (4 mg Intravenous Not Given 02/14/19 1635)  cefTRIAXone (ROCEPHIN) 2 g in sodium chloride 0.9 % 100 mL IVPB (2 g Intravenous New Bag/Given 02/14/19 1948)    And  metroNIDAZOLE (FLAGYL) IVPB 500 mg (has no administration in time range)  sodium chloride 0.9 % bolus 1,000 mL (0 mLs Intravenous Stopped 02/14/19 1906)  iohexol (OMNIPAQUE) 300 MG/ML solution 100 mL (100 mLs Intravenous Contrast Given 02/14/19 1739)     Initial Impression / Assessment and Plan / ED Course  I have reviewed the triage vital signs and the nursing notes.  Pertinent labs & imaging results that were available during my care of the patient were reviewed by me and considered in my medical decision making (see chart for details).  Clinical Course as of Feb 13 1953  Fri Feb 14, 2019  1438 Leukocytosis noted at 17.  WBC(!): 17.0 [AH]  1438 UA reveals small Hgb, protein, and rare bacteria.  Urinalysis, Routine w reflex microscopic(!) [AH]  1454 Wet prep reveals many WBCs. Negative for yeast, c lue cells, sperm, or trich.  Wet prep, genital(!) [AH]  1619 CMP is unremarkable.  Comprehensive metabolic panel [AH]  1808 CT abdomen reveals acute appendicitis. No evidence of appendiceal rupture or of a periappendiceal abscess. No other abnormalities.    CT Abdomen Pelvis W Contrast [AH]    Clinical Course User Index [AH] Leretha DykesHernandez, Mckay Brandt P, PA-C      Patient presents with RLQ abdominal pain. Vitals, labs, and imaging reviewed. Patient has leukocytosis noted at 17. Provided IVF and zofran. Ordered CT abdomen due to concern for appendicitis due to location of symptoms. CT abdomen  reveals acute appendicitis. Wet prep reveals many WBCs without yeast, clue cells, sperm, or trich. CMP is unremarkable. Patient is stable in no acute distress. Patient ate around 7pm without notifying anyone although she is npo. Consulted general surgery, Dr. Janee Mornhompson, due to appendicitis. Surgery recommended antibiotics and will admit patient. Antibiotics ordered in the ER. Patient will be admitted for surgery.  Final Clinical Impressions(s) / ED Diagnoses   Final diagnoses:  Right  lower quadrant abdominal pain  Acute appendicitis, unspecified acute appendicitis type    ED Discharge Orders    None       Arville Lime, [place] 02/14/19 1955    Daleen Bo, MD 02/14/19 2243

## 2019-02-14 NOTE — Telephone Encounter (Signed)
Pt reports right sided abdominal pain "More towards side", flank area. Onset last night 8-9/10. This am "Little better but shooting pains, I can tell something is not right." Denies dysuria, no hematuria. States did have diarrhea yesterday. Pain came about suddenly, does not recall any injury. Took Pepto and ibuprofen, ineffective.  Pt states "Never felt this type of pain before." Pt directed to UC/ED. States she will go to [facility]; made aware she may be sent to ED. Care advise given per protocol, verbalizes understanding.   Reason for Disposition . [1] SEVERE pain (e.g., excruciating) AND [2] present > 1 hour  Answer Assessment - Initial Assessment Questions 1. LOCATION: "Where does it hurt?"      Right lower side, mostly on side 2. RADIATION: "Does the pain shoot anywhere else?" (e.g., chest, back)     no 3. ONSET: "When did the pain begin?" (e.g., minutes, hours or days ago)      Last night 4. SUDDEN: "Gradual or sudden onset?"     sudden 5. PATTERN "Does the pain come and go, or is it constant?"    - If constant: "Is it getting better, staying the same, or worsening?"      (Note: Constant means the pain never goes away completely; most serious pain is constant and it progresses)     - If intermittent: "How long does it last?" "Do you have pain now?"     (Note: Intermittent means the pain goes away completely between bouts)     intermittent 6. SEVERITY: "How bad is the pain?"  (e.g., Scale 1-10; mild, moderate, or severe)   - MILD (1-3): doesn't interfere with normal activities, abdomen soft and not tender to touch    - MODERATE (4-7): interferes with normal activities or awakens from sleep, tender to touch    - SEVERE (8-10): excruciating pain, doubled over, unable to do any normal activities      8-9/10 yesterday, today shooting 7. RECURRENT SYMPTOM: "Have you ever had this type of abdominal pain before?" If so, ask: "When was the last time?" and "What happened that time?"      no 8.  CAUSE: "What do you think is causing the abdominal pain?"     unsure 9. RELIEVING/AGGRAVATING FACTORS: "What makes it better or worse?" (e.g., movement, antacids, bowel movement)     nothing 10. OTHER SYMPTOMS: "Has there been any vomiting, diarrhea, constipation, or urine problems?"       Diarrhea yesterday 11. PREGNANCY: "Is there any chance you are pregnant?" "When was your last menstrual period?"       no  Protocols used: ABDOMINAL PAIN - [facility]

## 2019-02-14 NOTE — Anesthesia Procedure Notes (Signed)
Procedure Name: Intubation Date/Time: 02/14/2019 9:21 PM Performed by: Babs Bertin, CRNA Pre-anesthesia Checklist: Patient identified, Emergency Drugs available, Suction available and Patient being monitored Patient Re-evaluated:Patient Re-evaluated prior to induction Oxygen Delivery Method: Circle System Utilized Preoxygenation: Pre-oxygenation with 100% oxygen Induction Type: IV induction and Rapid sequence Laryngoscope Size: Mac and 3 Grade View: Grade I Tube type: Oral Tube size: 7.0 mm Number of attempts: 1 Airway Equipment and Method: Stylet and Oral airway Placement Confirmation: ETT inserted through vocal cords under direct vision,  positive ETCO2 and breath sounds checked- equal and bilateral Secured at: 20 cm Tube secured with: Tape Dental Injury: Teeth and Oropharynx as per pre-operative assessment

## 2019-02-14 NOTE — ED Notes (Signed)
To ct

## 2019-02-14 NOTE — ED Triage Notes (Signed)
Pt in w/sharp RLQ pain since last night. Sent down from UC for further eval. States some nausea but no emesis

## 2019-02-14 NOTE — H&P (Signed)
Diana Farrell is an 23 y.o. female.   Chief Complaint: RLQ abdominal pain HPI: Otherwise healthy 23 year old female developed right lower quad abdominal pain last night.  It disrupted her sleep.  No significant nausea.  The pain was worse this morning and she came to the emergency department for evaluation.  Laboratory studies showed leukocytosis of 17,000.  CT scan of the abdomen and pelvis shows acute appendicitis without evidence of perforation.  I was asked to see her for surgical management.  Past Medical History:  Diagnosis Date  . Anemia, antepartum(648.23)   . Medical history non-contributory   . Other abscess of vulva   . SVD (spontaneous vaginal delivery) 08/05/2013    Past Surgical History:  Procedure Laterality Date  . WISDOM TOOTH EXTRACTION      Family History  Adopted: Yes  Family history unknown: Yes   Social History:  reports that she has never smoked. She has never used smokeless tobacco. She reports that she does not drink alcohol or use drugs.  Allergies: No Known Allergies  (Not in a hospital admission)   Results for orders placed or performed during the hospital encounter of 02/14/19 (from the past 48 hour(s))  Lipase, blood     Status: None   Collection Time: 02/14/19  1:42 PM  Result Value Ref Range   Lipase 26 11 - 51 U/L    Comment: Performed at [facility], [address], [place], [place] [address]  Comprehensive metabolic panel     Status: None   Collection Time: 02/14/19  1:42 PM  Result Value Ref Range   Sodium 138 135 - 145 mmol/L   Potassium 3.9 3.5 - 5.1 mmol/L   Chloride 103 98 - 111 mmol/L   CO2 28 22 - 32 mmol/L   Glucose, Bld 89 70 - 99 mg/dL   BUN 13 6 - 20 mg/dL   Creatinine, Ser 0.63 0.44 - 1.00 mg/dL   Calcium 9.5 8.9 - 10.3 mg/dL   Total Protein 7.3 6.5 - 8.1 g/dL   Albumin 4.2 3.5 - 5.0 g/dL   AST 21 15 - 41 U/L   ALT 17 0 - 44 U/L   Alkaline Phosphatase 56 38 - 126 U/L   Total Bilirubin 1.0 0.3 - 1.2 mg/dL   GFR  calc non Af Amer >60 >60 mL/min   GFR calc Af Amer >60 >60 mL/min   Anion gap 7 5 - 15    Comment: Performed at [facility] [address], [place], [place] [address]  CBC     Status: Abnormal   Collection Time: 02/14/19  1:42 PM  Result Value Ref Range   WBC 17.0 (H) 4.0 - 10.5 K/uL   RBC 4.42 3.87 - 5.11 MIL/uL   Hemoglobin 13.6 12.0 - 15.0 g/dL   HCT 40.7 36.0 - 46.0 %   MCV 92.1 80.0 - 100.0 fL   MCH 30.8 26.0 - 34.0 pg   MCHC 33.4 30.0 - 36.0 g/dL   RDW 12.3 11.5 - 15.5 %   Platelets 330 150 - 400 K/uL   nRBC 0.0 0.0 - 0.2 %    Comment: Performed at [facility], [place] [address], [place], [place] [address]  Urinalysis, Routine w reflex microscopic     Status: Abnormal   Collection Time: 02/14/19  2:00 PM  Result Value Ref Range   Color, Urine YELLOW YELLOW   APPearance HAZY (A) CLEAR   Specific Gravity, Urine 1.030 1.005 -  1.030   pH 5.0 5.0 - 8.0   Glucose, UA NEGATIVE NEGATIVE mg/dL   Hgb urine dipstick SMALL (A) NEGATIVE   Bilirubin Urine NEGATIVE NEGATIVE   Ketones, ur NEGATIVE NEGATIVE mg/dL   Protein, ur 30 (A) NEGATIVE mg/dL   Nitrite NEGATIVE NEGATIVE   Leukocytes,Ua NEGATIVE NEGATIVE   RBC / HPF 0-5 0 - 5 RBC/hpf   WBC, UA 0-5 0 - 5 WBC/hpf   Bacteria, UA RARE (A) NONE SEEN   Squamous Epithelial / LPF 0-5 0 - 5   Mucus PRESENT     Comment: Performed at [facility], [address] [place], [place] [address]  Wet prep, genital     Status: Abnormal   Collection Time: 02/14/19  2:20 PM   Specimen: Vaginal  Result Value Ref Range   Yeast Wet Prep HPF POC NONE SEEN NONE SEEN   Trich, Wet Prep NONE SEEN NONE SEEN   Clue Cells Wet Prep HPF POC NONE SEEN NONE SEEN   WBC, Wet Prep HPF POC MANY (A) NONE SEEN   Sperm NONE SEEN     Comment: Performed at [facility], [address] [place], [place] [address]  I-Stat beta hCG blood, ED     Status: None   Collection Time: 02/14/19  3:24 PM  Result Value Ref Range   I-stat hCG, quantitative  <5.0 <5 mIU/mL   Comment 3            Comment:   GEST. AGE      CONC.  (mIU/mL)   <=1 WEEK        5 - 50     2 WEEKS       50 - 500     3 WEEKS       100 - 10,000     4 WEEKS     1,000 - 30,000        FEMALE AND NON-PREGNANT FEMALE:     LESS THAN 5 mIU/mL   SARS Coronavirus 2 (CEPHEID - Performed in [facility] hospital lab), Hosp Order     Status: None   Collection Time: 02/14/19  6:45 PM   Specimen: Nasopharyngeal Swab  Result Value Ref Range   SARS Coronavirus 2 NEGATIVE NEGATIVE    Comment: (NOTE) If result is NEGATIVE SARS-CoV-2 target nucleic acids are NOT DETECTED. The SARS-CoV-2 RNA is generally detectable in upper and lower  respiratory specimens during the acute phase of infection. The lowest  concentration of SARS-CoV-2 viral copies this assay can detect is 250  copies / mL. A negative result does not preclude SARS-CoV-2 infection  and should not be used as the sole basis for treatment or other  patient management decisions.  A negative result may occur with  improper specimen collection / handling, submission of specimen other  than nasopharyngeal swab, presence of viral mutation(s) within the  areas targeted by this assay, and inadequate number of viral copies  (<250 copies / mL). A negative result must be combined with clinical  observations, patient history, and epidemiological information. If result is POSITIVE SARS-CoV-2 target nucleic acids are DETECTED. The SARS-CoV-2 RNA is generally detectable in upper and lower  respiratory specimens dur ing the acute phase of infection.  Positive  results are indicative of active infection with SARS-CoV-2.  Clinical  correlation with patient history and other diagnostic information is  necessary to determine patient infection status.  Positive results do  not rule out bacterial infection or co-infection  with other viruses. If result is PRESUMPTIVE POSTIVE SARS-CoV-2 nucleic acids MAY BE PRESENT.   A presumptive positive  result was obtained on the submitted specimen  and confirmed on repeat testing.  While 2019 novel coronavirus  (SARS-CoV-2) nucleic acids may be present in the submitted sample  additional confirmatory testing may be necessary for epidemiological  and / or clinical management purposes  to differentiate between  SARS-CoV-2 and other Sarbecovirus currently known to infect humans.  If clinically indicated additional testing with an alternate test  methodology [id number]) is advised. The SARS-CoV-2 RNA is generally  detectable in upper and lower respiratory sp ecimens during the acute  phase of infection. The expected result is Negative. Fact Sheet for Patients:  BoilerBrush.com.cyhttps://www.fda.gov/media/136312/download Fact Sheet for Healthcare Providers: https://pope.com/https://www.fda.gov/media/136313/download This test is not yet approved or cleared by the [place] FDA and has been authorized for detection and/or diagnosis of SARS-CoV-2 by FDA under an Emergency Use Authorization (EUA).  This EUA will remain in effect (meaning this test can be used) for the duration of the COVID-19 declaration under Section 564(b)(1) of the Act, 21 U.S.C. section 360bbb-3(b)(1), unless the authorization is terminated or revoked sooner. Performed at [facility], [address] [place], [place] [address]    Ct Abdomen Pelvis W Contrast  Result Date: 02/14/2019 CLINICAL DATA:  Abdominal pain.  Appendicitis suspected. EXAM: CT ABDOMEN AND PELVIS WITH CONTRAST TECHNIQUE: Multidetector CT imaging of the abdomen and pelvis was performed using the standard protocol following bolus administration of intravenous contrast. CONTRAST:  100mL OMNIPAQUE IOHEXOL 300 MG/ML  SOLN COMPARISON:  None. FINDINGS: Lower chest: Clear lung bases.  Heart normal in size. Hepatobiliary: No focal liver abnormality is seen. No gallstones, gallbladder wall thickening, or biliary dilatation. Pancreas: Unremarkable. No pancreatic ductal dilatation or  surrounding inflammatory changes. Spleen: Normal in size without focal abnormality. Adrenals/Urinary Tract: No adrenal masses. Kidneys normal size, orientation and position with symmetric enhancement. 3 mm nonobstructing stone in the upper pole of the left kidney. No other stones, no masses and no hydronephrosis. Normal ureters. Normal bladder. Stomach/Bowel: Appendix is dilated to 9 mm with a thickened enhancing wall. There is mild inflammatory haziness in the fat adjacent to the appendiceal tip. No extraluminal air or adjacent fluid collection. Stomach, small bowel and colon are unremarkable. Vascular/Lymphatic: No significant vascular findings are present. No enlarged abdominal or pelvic lymph nodes. Reproductive: Uterus and bilateral adnexa are unremarkable. Other: No abdominal wall hernia or abnormality. No abdominopelvic ascites. Musculoskeletal: No acute or significant osseous findings. IMPRESSION: 1. Acute appendicitis. No evidence of appendiceal rupture or of a periappendiceal abscess. 2. No other abnormalities. Electronically Signed   By: Amie Portlandavid  Ormond M.D.   On: 02/14/2019 18:04    Review of Systems  Constitutional: Negative for chills and fever.  HENT: Negative.   Eyes: Negative.   Respiratory: Negative.   Cardiovascular: Negative.   Gastrointestinal: Positive for abdominal pain and constipation. Negative for diarrhea, nausea and vomiting.  Genitourinary: Negative.   Musculoskeletal: Negative.   Skin: Negative.   Neurological: Negative.   Endo/Heme/Allergies: Negative.   Psychiatric/Behavioral: Negative.     Blood pressure 105/71, pulse (!) 111, temperature 98.2 F (36.8 C), resp. rate 18, weight 54.3 kg, SpO2 (!) 74 %. Physical Exam  Constitutional: She is oriented to person, place, and time. She appears well-developed and well-nourished.  HENT:  Head: Normocephalic and atraumatic.  Nose: Nose normal.  Mouth/Throat: Oropharynx is clear and moist.  Eyes: Pupils are equal, round,  and reactive  to light. EOM are normal.  Neck: No tracheal deviation present. No thyromegaly present.  Cardiovascular: Normal rate and regular rhythm.  Respiratory: Breath sounds normal. No respiratory distress. She has no wheezes.  GI: Soft. She exhibits no distension. There is abdominal tenderness. There is no rebound and no guarding.  Tender right lower quadrant without guarding or peritonitis  Musculoskeletal: Normal range of motion.        General: No edema.  Neurological: She is alert and oriented to person, place, and time.  Skin: Skin is warm.  Psychiatric: She has a normal mood and affect.     Assessment/Plan Acute appendicitis - IV Rocephin and Flagyl.  Will proceed to the operating room for laparoscopic appendectomy.  I discussed the procedure, risks, benefits in detail with her and she is agreeable.  COVID test is pending.  Liz MaladyBurke E Allaina Brotzman, MD 02/14/2019, 8:46 PM

## 2019-02-14 NOTE — ED Notes (Signed)
Pt asking about her c-t results  She has been eating snackssince she arrived  She is still on her phone

## 2019-02-14 NOTE — Telephone Encounter (Signed)
Pt is being evaluated at ED

## 2019-02-15 ENCOUNTER — Encounter (HOSPITAL_COMMUNITY): Payer: Self-pay | Admitting: General Surgery

## 2019-02-15 MED ORDER — IBUPROFEN 800 MG PO TABS: 800.0000 mg | ORAL_TABLET | Freq: Three times a day (TID) | ORAL | 0 refills | Status: DC | PRN

## 2019-02-15 MED ORDER — OXYCODONE HCL 5 MG PO TABS: 5.0000 mg | ORAL_TABLET | Freq: Four times a day (QID) | ORAL | 0 refills | Status: DC | PRN

## 2019-02-15 NOTE — Progress Notes (Signed)
Earley Brooke to be D/C'd  per MD order. Discussed with the patient and all questions fully answered.  VSS, Skin clean, dry and intact without evidence of skin break down, no evidence of skin tears noted.  IV catheter discontinued intact. Site without signs and symptoms of complications. Dressing and pressure applied.  An After Visit Summary was printed and given to the patient. Patient informed where to pickup prescription.  D/c education completed with patient/family including follow up instructions, medication list, d/c activities limitations if indicated, with other d/c instructions as indicated by MD - patient able to verbalize understanding, all questions fully answered.   Patient instructed to return to ED, call 911, or call MD for any changes in condition.   Patient to be escorted via [place], and D/C home via private auto.

## 2019-02-15 NOTE — Discharge Summary (Signed)
Physician Discharge Summary  Patient ID: Diana Farrell MRN: [id number] DOB/AGE: 1995-12-15 23 y.o.  Admit date: 02/14/2019 Discharge date: 02/15/2019  Admission Diagnoses:  Discharge Diagnoses:  Active Problems:   S/P laparoscopic appendectomy   Discharged Condition: good  Hospital Course: 23 yo female presented to ER with acute appendicitis. She underwent laparoscopic appendectomy and was observed overnight. She did well and was discharged home POD 1.  Consults: general surgery  Significant Diagnostic Studies: CT scan showing appendicitis  Treatments: surgery for appendectomy  Discharge Exam: Blood pressure 99/62, pulse 60, temperature 98.4 F (36.9 C), temperature source Oral, resp. rate 17, weight 54.3 kg, SpO2 94 %. General appearance: alert and cooperative Neck: no adenopathy, no carotid bruit, no JVD, supple, symmetrical, trachea midline and thyroid not enlarged, symmetric, no tenderness/mass/nodules GI: soft, non-tender; bowel sounds normal; no masses,  no organomegaly and incisions c/d/i no erythema or drainage  Disposition: Discharge disposition: 01-Home or Self Care       Discharge Instructions    Call MD for:  difficulty breathing, headache or visual disturbances   Complete by: As directed    Call MD for:  hives   Complete by: As directed    Call MD for:  persistant nausea and vomiting   Complete by: As directed    Call MD for:  redness, tenderness, or signs of infection (pain, swelling, redness, odor or green/yellow discharge around incision site)   Complete by: As directed    Call MD for:  severe uncontrolled pain   Complete by: As directed    Call MD for:  temperature >100.4   Complete by: As directed    Diet - low sodium heart healthy   Complete by: As directed    Discharge wound care:   Complete by: As directed    Ok to shower tomorrow. Glue will likely peel off in 1-3 weeks. No bandage required   Driving Restrictions   Complete by: As  directed    No driving while on narcotics   Increase activity slowly   Complete by: As directed    Lifting restrictions   Complete by: As directed    No lifting greater than 20 pounds for 3 weeks     Allergies as of 02/15/2019   No Known Allergies     Medication List    TAKE these medications   amphetamine-dextroamphetamine 10 MG tablet Commonly known as: ADDERALL Take 10 mg by mouth 2 (two) times daily as needed (to focus).   ibuprofen 800 MG tablet Commonly known as: ADVIL Take 1 tablet (800 mg total) by mouth every 8 (eight) hours as needed. What changed:   medication strength  when to take this  reasons to take this   norgestimate-ethinyl estradiol 0.25-35 MG-MCG tablet Commonly known as: Sprintec 28 Take 1 tablet by mouth daily.   oxyCODONE 5 MG immediate release tablet Commonly known as: Oxy IR/ROXICODONE Take 1 tablet (5 mg total) by mouth every 6 (six) hours as needed for severe pain.            Discharge Care Instructions  (From admission, onward)         Start     Ordered   02/15/19 0000  Discharge wound care:    Comments: Ok to shower tomorrow. Glue will likely peel off in 1-3 weeks. No bandage required   02/15/19 0859           Signed: Arta Bruce Kinsinger 02/15/2019, 9:00 AM

## 2019-02-18 LAB — GC/CHLAMYDIA PROBE AMP (~~LOC~~) NOT AT ARMC
Chlamydia: NEGATIVE
Neisseria Gonorrhea: NEGATIVE

## 2019-02-21 ENCOUNTER — Other Ambulatory Visit: Payer: Self-pay

## 2019-02-21 ENCOUNTER — Encounter (HOSPITAL_COMMUNITY): Payer: Self-pay

## 2019-02-21 ENCOUNTER — Emergency Department (HOSPITAL_COMMUNITY)
Admission: EM | Admit: 2019-02-21 | Discharge: 2019-02-21 | Disposition: A | Discharge status: Home / Self Care | Payer: BC Managed Care – PPO | Attending: Emergency Medicine | Admitting: Emergency Medicine

## 2019-02-21 DIAGNOSIS — R1084 Generalized abdominal pain: Secondary | ICD-10-CM | POA: Diagnosis not present

## 2019-02-21 DIAGNOSIS — Z79899 Other long term (current) drug therapy: Secondary | ICD-10-CM | POA: Insufficient documentation

## 2019-02-21 DIAGNOSIS — K5900 Constipation, unspecified: Secondary | ICD-10-CM | POA: Diagnosis not present

## 2019-02-21 DIAGNOSIS — R102 Pelvic and perineal pain: Secondary | ICD-10-CM | POA: Diagnosis present

## 2019-02-21 LAB — POC URINE PREG, ED: Preg Test, Ur: NEGATIVE

## 2019-02-21 LAB — URINALYSIS, ROUTINE W REFLEX MICROSCOPIC
Bilirubin Urine: NEGATIVE
Glucose, UA: NEGATIVE mg/dL
Hgb urine dipstick: NEGATIVE
Ketones, ur: NEGATIVE mg/dL
Leukocytes,Ua: NEGATIVE
Nitrite: NEGATIVE
Protein, ur: NEGATIVE mg/dL
Specific Gravity, Urine: 1.021 (ref 1.005–1.030)
pH: 6 (ref 5.0–8.0)

## 2019-02-21 NOTE — ED Triage Notes (Signed)
Pt presents to the ED with LLQ abdominal pain. Pt is post op appendectomy. Pt reports onset of pain one hour prior to arrival after intercourse. She has been able to tolerate food and fluids since surgery. She denies fever, nausea or vomiting.

## 2019-02-21 NOTE — ED Provider Notes (Signed)
[facility]GENC[facility] DEPARTMENT Provider Note   CSN: [id number] Arrival date & time: 02/21/19  0149     History   Chief Complaint Chief Complaint  Patient presents with  . Abdominal Pain    HPI Diana Farrell is a 23 y.o. female.     The history is provided by the patient.  Abdominal Pain Pain location: Pelvic. Pain quality: sharp   Pain radiation: Rectum. Pain severity:  Moderate Onset quality:  Sudden Timing:  Constant Progression:  Resolved Chronicity:  New Context: recent sexual activity   Relieved by:  None tried Worsened by:  Nothing Associated symptoms: constipation   Associated symptoms: no diarrhea, no fever, no hematemesis, no hematochezia, no melena, no vaginal bleeding, no vaginal discharge and no vomiting   Risk factors comment:  Recent appendectomy  Patient underwent an appendectomy on June 12 without complication.  Since being discharged she has done well, she has been taking p.o. fluids and meals, has had one bowel movement since surgery. No vaginal bleeding or discharge.  She has had intercourse since surgery without difficulty. Tonight she underwent vaginal intercourse, and soon after began having lower abdominal/pelvic pain.  The pain appeared to radiate into her rectum.  She has now improved. She had no pain during intercourse Past Medical History:  Diagnosis Date  . Anemia, antepartum(648.23)   . Medical history non-contributory   . Other abscess of vulva   . SVD (spontaneous vaginal delivery) 08/05/2013    Patient Active Problem List   Diagnosis Date Noted  . S/P laparoscopic appendectomy 02/14/2019    Past Surgical History:  Procedure Laterality Date  . LAPAROSCOPIC APPENDECTOMY N/A 02/14/2019   Procedure: APPENDECTOMY LAPAROSCOPIC;  Surgeon: Georganna Skeans, MD;  Location: [facility];  Service: General;  Laterality: N/A;  . WISDOM TOOTH EXTRACTION       OB History    Gravida  2   Para  1   Term  1   Preterm       AB      Living  1     SAB      TAB      Ectopic      Multiple      Live Births  1            Home Medications    Prior to Admission medications   Medication Sig Start Date End Date Taking? Authorizing Provider  amphetamine-dextroamphetamine (ADDERALL) 10 MG tablet Take 10 mg by mouth 2 (two) times daily as needed (to focus).  01/21/19   [provider]  ibuprofen (ADVIL) 800 MG tablet Take 1 tablet (800 mg total) by mouth every 8 (eight) hours as needed. 02/15/19   Kinsinger, Arta Bruce, MD  norgestimate-ethinyl estradiol ([place] 28) 0.25-35 MG-MCG tablet Take 1 tablet by mouth daily. Patient not taking: Reported on 02/14/2019 10/04/18   Forrest Moron, MD  oxyCODONE (OXY IR/ROXICODONE) 5 MG immediate release tablet Take 1 tablet (5 mg total) by mouth every 6 (six) hours as needed for severe pain. 02/15/19   Kinsinger, Arta Bruce, MD    Family History Family History  Adopted: Yes  Family history unknown: Yes    Social History Social History   Tobacco Use  . Smoking status: Never Smoker  . Smokeless tobacco: Never Used  Substance Use Topics  . Alcohol use: No  . Drug use: No     Allergies   Patient has no known allergies.   Review of Systems Review of Systems  Constitutional: Negative for fever.  Gastrointestinal: Positive for abdominal pain and constipation. Negative for blood in stool, diarrhea, hematemesis, hematochezia, melena and vomiting.  Genitourinary: Negative for vaginal bleeding and vaginal discharge.  All other systems reviewed and are negative.    Physical Exam Updated Vital Signs BP 115/86   Pulse 89   Temp 98.6 F (37 C) (Oral)   Resp 18   Ht 1.499 m (4\' 11" )   Wt 61.2 kg   SpO2 99%   BMI 27.27 kg/m   Physical Exam  CONSTITUTIONAL: Well developed/well nourished HEAD: Normocephalic/atraumatic EYES: EOMI/PERRL ENMT: Mucous membranes moist NECK: supple no meningeal signs SPINE/BACK:entire spine nontender CV:  S1/S2 noted, no murmurs/rubs/gallops noted LUNGS: Lungs are clear to auscultation bilaterally, no apparent distress ABDOMEN: soft, nontender, no rebound or guarding, bowel sounds noted throughout abdomen, well-healing incisions No focal tenderness GU:no cva tenderness NEURO: Pt is awake/alert/appropriate, moves all extremitiesx4.  No facial droop.   EXTREMITIES: pulses normal/equal, full ROM SKIN: warm, color normal PSYCH: no abnormalities of mood noted, alert and oriented to situation  ED Treatments / Results  Labs (all labs ordered are listed, but only abnormal results are displayed) Labs Reviewed  URINALYSIS, ROUTINE W REFLEX MICROSCOPIC - Abnormal; Notable for the following components:      Result Value   APPearance CLOUDY (*)    All other components within normal limits  POC URINE PREG, ED    EKG    Radiology No results found.  Procedures Procedures (including critical care time)  Medications Ordered in ED Medications - No data to display   Initial Impression / Assessment and Plan / ED Course  I have reviewed the triage vital signs and the nursing notes.  Pertinent labs  results that were available during my care of the patient were reviewed by me and considered in my medical decision making (see chart for details).        2:57 AM Patient well-appearing, already improving.  She is drinking a soda for [employer].  Urinalysis pending at this time.  3:57 AM Patient improved, no acute distress, taking fluids without difficulty No signs of acute abdominal emergency or postoperative complication. Advised to add on stool softener with her pain medications as I  suspect there is some constipation Final Clinical Impressions(s) / ED Diagnoses   Final diagnoses:  Generalized abdominal pain    ED Discharge Orders    None       Zadie RhineWickline, Estell Dillinger, MD 02/21/19 [phone]

## 2019-02-21 NOTE — ED Notes (Signed)
On arrival pt eating [employer] when attempting to obtain vitals. Pt informed to not eat or drink.

## 2019-10-18 ENCOUNTER — Encounter (HOSPITAL_COMMUNITY): Payer: Self-pay

## 2019-10-18 ENCOUNTER — Ambulatory Visit (HOSPITAL_COMMUNITY)
Admission: EM | Admit: 2019-10-18 | Discharge: 2019-10-18 | Disposition: A | Discharge status: Home / Self Care | Payer: BC Managed Care – PPO | Attending: Family Medicine | Admitting: Family Medicine

## 2019-10-18 DIAGNOSIS — Z113 Encounter for screening for infections with a predominantly sexual mode of transmission: Secondary | ICD-10-CM | POA: Diagnosis present

## 2019-10-18 DIAGNOSIS — N898 Other specified noninflammatory disorders of vagina: Secondary | ICD-10-CM | POA: Diagnosis not present

## 2019-10-18 MED ORDER — DOXYCYCLINE HYCLATE 100 MG PO CAPS: 100.0000 mg | ORAL_CAPSULE | Freq: Two times a day (BID) | ORAL | 0 refills | Status: DC

## 2019-10-18 NOTE — ED Triage Notes (Signed)
Pt reports having a cyst in the vagina outer lips x 10 years, is burning in the past week. Pt request STD's testing.

## 2019-10-18 NOTE — Discharge Instructions (Signed)
We have sent testing for sexually transmitted infections. We will notify you of any positive results once they are received. If required, we will prescribe any medications you might need.  Please refrain from all sexual activity for at least the next seven days.  

## 2019-10-19 ENCOUNTER — Other Ambulatory Visit: Payer: Self-pay | Admitting: Family Medicine

## 2019-10-19 DIAGNOSIS — Z3041 Encounter for surveillance of contraceptive pills: Secondary | ICD-10-CM

## 2019-10-19 NOTE — Telephone Encounter (Signed)
Requested medication (s) are due for refill today: yes  Requested medication (s) are on the active medication list: yes  Last refill:  10/04/18  Future visit scheduled: no  Notes to clinic:  > 3 months overdue for appt   Requested Prescriptions  Pending Prescriptions Disp Refills   SPRINTEC 28 0.25-35 MG-MCG tablet [Pharmacy Med Name: SPRINTEC 28 DAY TABLET] 84 tablet 3    Sig: TAKE 1 TABLET BY MOUTH EVERY DAY      OB/GYN:  Contraceptives Failed - 10/19/2019 10:31 AM      Failed - Valid encounter within last 12 months    Recent Outpatient Visits           1 year ago Generalized anxiety disorder   [facility], Manus Rudd, MD   1 year ago Vulvovaginitis   Primary Care at [facility], Madelaine Bhat, PA-C   1 year ago Urinary urgency   [facility], Dema Severin, PA-C   1 year ago Urinary frequency   Primary Care at [facility], Manus Rudd, MD   2 years ago Encounter for Papanicolaou smear for cervical cancer screening   [facility], Meda Coffee, MD              Passed - Last BP in normal range    BP Readings from Last 1 Encounters:  10/18/19 106/79

## 2019-10-20 NOTE — ED Provider Notes (Signed)
[place]   [id number] 10/18/19 Arrival Time: 4034  ASSESSMENT & PLAN:  1. Vaginal irritation   2. Screening for STDs (sexually transmitted diseases)     Recommend: Follow-up Information    Schedule an appointment as soon as possible for a visit  with [facility].   Specialty: Obstetrics and Gynecology Contact information: [address] [place], [place] [id number] [place] [id number] [phone]            Discharge Instructions     We have sent testing for sexually transmitted infections. We will notify you of any positive results once they are received. If required, we will prescribe any medications you might need.  Please refrain from all sexual activity for at least the next seven days.    Begin, should she be developing a vaginal abscess. Meds ordered this encounter  Medications  . doxycycline (VIBRAMYCIN) 100 MG capsule    Sig: Take 1 capsule (100 mg total) by mouth 2 (two) times daily.    Dispense:  14 capsule    Refill:  0    Pending: Labs Reviewed  CERVICOVAGINAL ANCILLARY ONLY    Will notify of any positive results. Instructed to refrain from sexual activity for at least seven days.  Reviewed expectations re: course of current medical issues. Questions answered. Outlined signs and symptoms indicating need for more acute intervention. Patient verbalized understanding. After Visit Summary given.   SUBJECTIVE:  Diana Farrell is a 24 y.o. female who reports 10 year h/o "a cyst in my vagina"; occurs every once in awhile. No specific previous evaluation reported. "They come up the go away slowly but they're painful". First noticed R-sided vaginal discomfort over the past week; worse yesterday. No skin changes or drainage reported. No vaginal bleeding. Does request STD screening. Normal bowel/bladder habits. No abdominal or pelvic pain. No n/v/d.  OBJECTIVE:  Vitals:   10/18/19 1057  BP: 106/79  Pulse: 65  Resp: 17  Temp: 98.8 F (37.1 C)  TempSrc: Oral  SpO2: 100%     General appearance: alert, cooperative, appears stated age and no distress Throat: lips, mucosa, and tongue normal; teeth and gums normal Lungs: unlabored respirations Back: no CVA tenderness; FROM at waist Abdomen: soft, non-tender GU: normal appearing external genitalia; she reports a small area of tenderness over R labia thought I do not appreciated any mass or any lesions Skin: warm and dry Psychological: alert and cooperative; normal mood and affect.    Labs Reviewed  CERVICOVAGINAL ANCILLARY ONLY    No Known Allergies  Past Medical History:  Diagnosis Date  . Anemia, antepartum(648.23)   . Medical history non-contributory   . Other abscess of vulva   . SVD (spontaneous vaginal delivery) 08/05/2013   Family History  Adopted: Yes  Family history unknown: Yes   Social History   Socioeconomic History  . Marital status: Single    Spouse name: Not on file  . Number of children: Not on file  . Years of education: Not on file  . Highest education level: Not on file  Occupational History  . Not on file  Tobacco Use  . Smoking status: Never Smoker  . Smokeless tobacco: Never Used  Substance and Sexual Activity  . Alcohol use: No  . Drug use: No  . Sexual activity: Yes    Birth control/protection: Pill  Other Topics Concern  . Not on file  Social History Narrative   Adopted at 20 months from [place].  Social Determinants of Health   Financial Resource Strain:   . Difficulty of Paying Living Expenses: Not on file  Food Insecurity:   . Worried About Programme researcher, broadcasting/film/video in the Last Year: Not on file  . Ran Out of Food in the Last Year: Not on file  Transportation Needs:   . Lack of Transportation (Medical): Not on file  . Lack of Transportation (Non-Medical): Not on file  Physical Activity:   . Days of Exercise per Week: Not on file  . Minutes of  Exercise per Session: Not on file  Stress:   . Feeling of Stress : Not on file  Social Connections:   . Frequency of Communication with Friends and Family: Not on file  . Frequency of Social Gatherings with Friends and Family: Not on file  . Attends Religious Services: Not on file  . Active Member of Clubs or Organizations: Not on file  . Attends Banker Meetings: Not on file  . Marital Status: Not on file  Intimate Partner Violence:   . Fear of Current or Ex-Partner: Not on file  . Emotionally Abused: Not on file  . Physically Abused: Not on file  . Sexually Abused: Not on file          Mardella Layman, MD 10/20/19 1309

## 2019-10-21 LAB — CERVICOVAGINAL ANCILLARY ONLY
Bacterial vaginitis: NEGATIVE
Candida vaginitis: POSITIVE — AB
Chlamydia: NEGATIVE
Neisseria Gonorrhea: NEGATIVE
Trichomonas: NEGATIVE

## 2019-10-22 ENCOUNTER — Telehealth (HOSPITAL_COMMUNITY): Payer: Self-pay | Admitting: Emergency Medicine

## 2019-10-22 MED ORDER — FLUCONAZOLE 150 MG PO TABS: 150.0000 mg | ORAL_TABLET | Freq: Once | ORAL | 0 refills | Status: AC

## 2019-10-22 NOTE — Telephone Encounter (Signed)
Test for candida (yeast) was positive.  Prescription for fluconazole 150mg po now, repeat dose in 3d if needed, #2 no refills, sent to the pharmacy of record.  Recheck or followup with PCP for further evaluation if symptoms are not improving.    Patient contacted by phone and made aware of    results. Pt verbalized understanding and had all questions answered.    

## 2019-11-17 ENCOUNTER — Emergency Department (HOSPITAL_COMMUNITY): Payer: BC Managed Care – PPO

## 2019-11-17 ENCOUNTER — Emergency Department (HOSPITAL_COMMUNITY)
Admission: EM | Admit: 2019-11-17 | Discharge: 2019-11-18 | Disposition: A | Discharge status: Other Acute Inpatient Hospital | Payer: BC Managed Care – PPO | Attending: Emergency Medicine | Admitting: Emergency Medicine

## 2019-11-17 DIAGNOSIS — F332 Major depressive disorder, recurrent severe without psychotic features: Secondary | ICD-10-CM | POA: Diagnosis not present

## 2019-11-17 DIAGNOSIS — Z046 Encounter for general psychiatric examination, requested by authority: Secondary | ICD-10-CM | POA: Insufficient documentation

## 2019-11-17 DIAGNOSIS — T1490XA Injury, unspecified, initial encounter: Secondary | ICD-10-CM

## 2019-11-17 DIAGNOSIS — Y929 Unspecified place or not applicable: Secondary | ICD-10-CM | POA: Diagnosis not present

## 2019-11-17 DIAGNOSIS — Y999 Unspecified external cause status: Secondary | ICD-10-CM | POA: Insufficient documentation

## 2019-11-17 DIAGNOSIS — S0990XA Unspecified injury of head, initial encounter: Secondary | ICD-10-CM | POA: Insufficient documentation

## 2019-11-17 DIAGNOSIS — Y9389 Activity, other specified: Secondary | ICD-10-CM | POA: Diagnosis not present

## 2019-11-17 DIAGNOSIS — Z20822 Contact with and (suspected) exposure to covid-19: Secondary | ICD-10-CM | POA: Diagnosis not present

## 2019-11-17 DIAGNOSIS — F1290 Cannabis use, unspecified, uncomplicated: Secondary | ICD-10-CM | POA: Diagnosis not present

## 2019-11-17 DIAGNOSIS — R45851 Suicidal ideations: Secondary | ICD-10-CM | POA: Diagnosis not present

## 2019-11-17 DIAGNOSIS — S0033XA Contusion of nose, initial encounter: Secondary | ICD-10-CM | POA: Diagnosis present

## 2019-11-17 LAB — COMPREHENSIVE METABOLIC PANEL
ALT: 21 U/L (ref 0–44)
AST: 25 U/L (ref 15–41)
Albumin: 3.8 g/dL (ref 3.5–5.0)
Alkaline Phosphatase: 50 U/L (ref 38–126)
Anion gap: 14 (ref 5–15)
BUN: 12 mg/dL (ref 6–20)
CO2: 24 mmol/L (ref 22–32)
Calcium: 9.1 mg/dL (ref 8.9–10.3)
Chloride: 104 mmol/L (ref 98–111)
Creatinine, Ser: 0.85 mg/dL (ref 0.44–1.00)
GFR calc Af Amer: >60 mL/min (ref >60)
GFR calc non Af Amer: >60 mL/min (ref >60)
Glucose, Bld: 84 mg/dL (ref 70–99)
Potassium: 3.5 mmol/L (ref 3.5–5.1)
Sodium: 142 mmol/L (ref 135–145)
Total Bilirubin: 0.5 mg/dL (ref 0.3–1.2)
Total Protein: 7.4 g/dL (ref 6.5–8.1)

## 2019-11-17 LAB — RAPID URINE DRUG SCREEN, HOSP PERFORMED
Amphetamines: NOT DETECTED
Barbiturates: NOT DETECTED
Benzodiazepines: POSITIVE — AB
Cocaine: NOT DETECTED
Opiates: NOT DETECTED
Tetrahydrocannabinol: POSITIVE — AB

## 2019-11-17 LAB — URINALYSIS, ROUTINE W REFLEX MICROSCOPIC
Bacteria, UA: NONE SEEN
Bilirubin Urine: NEGATIVE
Glucose, UA: NEGATIVE mg/dL
Hgb urine dipstick: NEGATIVE
Ketones, ur: NEGATIVE mg/dL
Leukocytes,Ua: NEGATIVE
Nitrite: NEGATIVE
Protein, ur: 30 mg/dL — AB
Specific Gravity, Urine: 1.025 (ref 1.005–1.030)
pH: 6 (ref 5.0–8.0)

## 2019-11-17 LAB — I-STAT CHEM 8, ED
BUN: 13 mg/dL (ref 6–20)
Calcium, Ion: 1.13 mmol/L — ABNORMAL LOW (ref 1.15–1.40)
Chloride: 106 mmol/L (ref 98–111)
Creatinine, Ser: 0.8 mg/dL (ref 0.44–1.00)
Glucose, Bld: 81 mg/dL (ref 70–99)
HCT: 42 % (ref 36.0–46.0)
Hemoglobin: 14.3 g/dL (ref 12.0–15.0)
Potassium: 3.5 mmol/L (ref 3.5–5.1)
Sodium: 141 mmol/L (ref 135–145)
TCO2: 25 mmol/L (ref 22–32)

## 2019-11-17 LAB — CBC
HCT: 43.7 % (ref 36.0–46.0)
Hemoglobin: 14.3 g/dL (ref 12.0–15.0)
MCH: 31.1 pg (ref 26.0–34.0)
MCHC: 32.7 g/dL (ref 30.0–36.0)
MCV: 95 fL (ref 80.0–100.0)
Platelets: 328 10*3/uL (ref 150–400)
RBC: 4.6 MIL/uL (ref 3.87–5.11)
RDW: 12.1 % (ref 11.5–15.5)
WBC: 11.8 10*3/uL — ABNORMAL HIGH (ref 4.0–10.5)
nRBC: 0 % (ref 0.0–0.2)

## 2019-11-17 LAB — SAMPLE TO BLOOD BANK

## 2019-11-17 LAB — I-STAT BETA HCG BLOOD, ED (MC, WL, AP ONLY): I-stat hCG, quantitative: <5 m[IU]/mL (ref <5)

## 2019-11-17 LAB — LACTIC ACID, PLASMA: Lactic Acid, Venous: 1.1 mmol/L (ref 0.5–1.9)

## 2019-11-17 LAB — RESPIRATORY PANEL BY RT PCR (FLU A&B, COVID)
Influenza A by PCR: NEGATIVE
Influenza B by PCR: NEGATIVE
SARS Coronavirus 2 by RT PCR: NEGATIVE

## 2019-11-17 LAB — PROTIME-INR
INR: 1 (ref 0.8–1.2)
Prothrombin Time: 13 seconds (ref 11.4–15.2)

## 2019-11-17 LAB — ETHANOL: Alcohol, Ethyl (B): <10 mg/dL (ref <10)

## 2019-11-17 MED ORDER — AMPHETAMINE-DEXTROAMPHETAMINE 5 MG PO TABS: 10.0000 mg | ORAL_TABLET | Freq: Two times a day (BID) | ORAL | Status: DC | PRN

## 2019-11-17 MED ORDER — LORAZEPAM 2 MG/ML IJ SOLN
2.0000 mg | Freq: Four times a day (QID) | INTRAMUSCULAR | Status: DC | PRN
  Administered 2019-11-18: 2 mg via INTRAMUSCULAR
  Filled 2019-11-17: qty 1

## 2019-11-17 MED ORDER — ZIPRASIDONE MESYLATE 20 MG IM SOLR
20.0000 mg | Freq: Two times a day (BID) | INTRAMUSCULAR | Status: DC | PRN
  Administered 2019-11-17: 20 mg via INTRAMUSCULAR
  Filled 2019-11-17: qty 10

## 2019-11-17 MED ORDER — NORGESTIMATE-ETH ESTRADIOL 0.25-35 MG-MCG PO TABS: 1.0000 | ORAL_TABLET | Freq: Every day | ORAL | Status: DC

## 2019-11-17 MED ORDER — FLUOXETINE HCL 20 MG PO CAPS
20.0000 mg | ORAL_CAPSULE | Freq: Every day | ORAL | Status: DC
  Filled 2019-11-17 (×2): qty 1
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
## 2019-11-17 NOTE — ED Notes (Signed)
Please ask Pt to call Lilli Light when she is awake. [phone]

## 2019-11-17 NOTE — Progress Notes (Signed)
Pt accepted to [facility].   Dr. Izola Price is the accepting/attending provider.    Call report to [phone]  Magda Paganini @ [facility] ED notified.     Pt is in the process of being IVC'd and will be transported by law enforcement.   Pt may arrive to [facility] after 3pm.   Wells Guiles, LCSW, LCAS Disposition CSW [facility] BHH/TTS [phone]

## 2019-11-17 NOTE — ED Notes (Signed)
Pt at desk calling mother . Pt calling this Clinical research associate a F.. stupid nurse. Pt also cursing at mother and calling her Mother a stupid Research officer, political party.

## 2019-11-17 NOTE — ED Notes (Signed)
Tele psych machine at bedside 

## 2019-11-17 NOTE — ED Notes (Signed)
Pt is aware of UA need  

## 2019-11-17 NOTE — ED Provider Notes (Signed)
7:30 PM-I was called to the room because the patient's nurse was concerned about agitation.  When I arrived the patient was alert, and immediately told me that "I want to leave."  I explained to her that she was under involuntary commitment and could not leave without psychiatry being involved with that decision.  He then stated "I am not talking suicidal."  She said she wanted to go home and take care of her daughter and go to work.  She denied suicidal ideation or prior self injury.  I ordered medications for agitation sedation and told the nurse that if she needed to be physically restrained, we could do that.  Noted that the patient is under involuntary commitment.  Note that she has been seen by behavioral health, and they plan on admitting her to a psychiatric hospital.     Mancel Bale, MD 11/17/19 1947

## 2019-11-17 NOTE — Progress Notes (Signed)
Pt meets inpatient criteria. Referral information has been sent to the following hospitals for review:  [facility] Details [facility] Details  [facility] Details  [facility] Details  [facility] Details [facility]  Disposition will continue to follow.    Wells Guiles, LCSW, LCAS Disposition CSW [facility] BHH/TTS [phone]

## 2019-11-17 NOTE — ED Notes (Signed)
Is now talking to her Aunt Kathie Rhodes
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
## 2019-11-17 NOTE — ED Notes (Signed)
Aunt, Kriste Basque would like an update. [phone]

## 2019-11-17 NOTE — Progress Notes (Signed)
CSW received phone call from admissions staff at [facility]. They are requesting pt be transported tomorrow morning (3/16) after 8amMagda Paganini @ [facility] ED notified.   Wells Guiles, LCSW, LCAS Disposition CSW [facility] BHH/TTS [phone]

## 2019-11-17 NOTE — ED Notes (Signed)
ED Provider at bedside. 
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
## 2019-11-17 NOTE — ED Notes (Signed)
Pt asking for meds to put her to sleep

## 2019-11-17 NOTE — ED Notes (Signed)
Patient back to room and pulled code button; Pt states she was trying to cut the lights off; RN asked patient if she saw the words "Code" on the button. Patient states "I'm on a kindergarten level". Patient could not be deescalated so PRN med was adminstered-Monique,RN

## 2019-11-17 NOTE — ED Notes (Signed)
Pt requested an update on plan of care. This writer asked Pt to return to her room for update on plan of care. Pt was informed she had been accepted to [facility] . Pt reported she refused to go anywhere. DR Jeraldine Loots updated on Pt refusal to go to [facility].
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
## 2019-11-17 NOTE — ED Notes (Signed)
Patient is displaying manipulative behavior at the begin of shift; pt has interrupted shift report several times when asked to go back to her room; EDP advised and has assessed patient; Patient threw herself on the ground crying and asking for RN to give her and IV "to knock her out" patient allowed to called mother for 2 minutes and is now crying uncontrolably at the desk and cursing; RN has asked patient several times to not speak so loud and to stop cursing in common areas-Monique,RN

## 2019-11-18 ENCOUNTER — Other Ambulatory Visit: Payer: Self-pay

## 2019-11-18 DIAGNOSIS — S0033XA Contusion of nose, initial encounter: Secondary | ICD-10-CM | POA: Diagnosis not present

## 2019-11-18 NOTE — ED Notes (Addendum)
Pt given Ativan as requested - Pt tolerated injection well. Pt noted to be tearful. States she feels Prozac is "what is making me bat-shit crazy". Pt threw medicine cup and pill across room. Pt picked up after being asked to do so by RN.

## 2019-11-18 NOTE — ED Notes (Signed)
Deputy called and advised he is en route to come transport pt to [facility]. ALL Belongings - 1 labeled belongings bag and 1 valuables envelope - Deputy - Pt aware. Pt changing into blue paper scrub top from burgundy top. Pt has on blue paper scrubs pants.

## 2019-11-18 NOTE — ED Notes (Addendum)
Attempted to call report - asked for RN for to return call in approx 30 minutes. Deputy aware of need for transport.

## 2019-11-18 NOTE — ED Notes (Signed)
Pt noted to be anxious and manic this am. Pt requesting multiple items. Breakfast tray delivered to pt. Pt currently in shower.

## 2019-11-18 NOTE — ED Notes (Signed)
[facility] after 8am  Breakfast ordered

## 2019-11-18 NOTE — ED Notes (Addendum)
Pt noted w/discoloration to nasal bridge. Pt noted to be irritable. Pt refused to put on pad, mesh underwear, and pants after showering. Stating "It's not like you haven't seen a woman naked before". Pt ambulated in hallway w/towel covering lower body. Pt aware waiting for deputy to transport which may be a couple of hours. Pt stated "then give me a fucking shot and knock my ass out!". Pt calling family member notifying of tx plan - accepted to [facility]. Pt noted to be cursing loudly - encouraged pt not to curse or she will have to hang up phone - voiced understanding.

## 2019-12-02 MED FILL — Ziprasidone Mesylate For Inj 20 MG (Base Equivalent): INTRAMUSCULAR | Qty: 20 | Status: AC

## 2020-02-25 LAB — OB RESULTS CONSOLE HIV ANTIBODY (ROUTINE TESTING): HIV: NONREACTIVE

## 2020-02-25 LAB — OB RESULTS CONSOLE RPR: RPR: NONREACTIVE

## 2020-02-25 LAB — OB RESULTS CONSOLE HEPATITIS B SURFACE ANTIGEN: Hepatitis B Surface Ag: NEGATIVE

## 2020-02-25 LAB — OB RESULTS CONSOLE RUBELLA ANTIBODY, IGM: Rubella: IMMUNE

## 2020-03-04 DIAGNOSIS — Z419 Encounter for procedure for purposes other than remedying health state, unspecified: Secondary | ICD-10-CM | POA: Diagnosis not present

## 2020-04-04 DIAGNOSIS — Z419 Encounter for procedure for purposes other than remedying health state, unspecified: Secondary | ICD-10-CM | POA: Diagnosis not present

## 2020-05-05 DIAGNOSIS — Z419 Encounter for procedure for purposes other than remedying health state, unspecified: Secondary | ICD-10-CM | POA: Diagnosis not present

## 2020-07-05 DIAGNOSIS — Z419 Encounter for procedure for purposes other than remedying health state, unspecified: Secondary | ICD-10-CM | POA: Diagnosis not present

## 2020-08-04 DIAGNOSIS — Z419 Encounter for procedure for purposes other than remedying health state, unspecified: Secondary | ICD-10-CM | POA: Diagnosis not present

## 2020-09-02 LAB — OB RESULTS CONSOLE GBS: GBS: NEGATIVE

## 2020-09-04 DIAGNOSIS — Z419 Encounter for procedure for purposes other than remedying health state, unspecified: Secondary | ICD-10-CM | POA: Diagnosis not present

## 2020-09-04 NOTE — L&D Delivery Note (Signed)
DELIVERY NOTE  Pt complete and at +2 station with urge to push. Epidural controlling pain. Pt pushed and delivered a viable female infant in ROA position. Loose nuchal, delivered through. Anterior and posterior shoulders spontaneously delivered with next two pushes; body easily followed next. Infant placed on mothers abdomen and bulb suction of mouth and nose performed. Cord was then clamped and cut by DOB. Cord blood obtained, 3VC. Baby had a vigorous spontaneous cry noted. Placenta then delivered at 1841 intact. Fundal massage performed and pitocin per protocol. Fundus firm. The following lacerations were noted: NONE. Mother and baby stable. Counts correct   Infant time: 32 Gender: female Placenta time: 1841 Apgars: 8/9 Weight: pending skin-to-skin

## 2020-09-15 ENCOUNTER — Encounter (HOSPITAL_COMMUNITY): Payer: Self-pay | Admitting: *Deleted

## 2020-09-15 ENCOUNTER — Telehealth (HOSPITAL_COMMUNITY): Payer: Self-pay | Admitting: *Deleted

## 2020-09-15 NOTE — Telephone Encounter (Signed)
Preadmission screen  

## 2020-09-23 ENCOUNTER — Inpatient Hospital Stay (HOSPITAL_COMMUNITY)
Admission: AD | Admit: 2020-09-23 | Discharge: 2020-09-25 | DRG: 807 | Disposition: A | Discharge status: Home / Self Care | Payer: Medicaid Other | Attending: Obstetrics and Gynecology | Admitting: Obstetrics and Gynecology

## 2020-09-23 ENCOUNTER — Inpatient Hospital Stay (HOSPITAL_COMMUNITY): Payer: Medicaid Other | Admitting: Anesthesiology

## 2020-09-23 ENCOUNTER — Other Ambulatory Visit: Payer: Self-pay

## 2020-09-23 ENCOUNTER — Encounter (HOSPITAL_COMMUNITY): Payer: Self-pay | Admitting: Obstetrics and Gynecology

## 2020-09-23 DIAGNOSIS — Z3A39 39 weeks gestation of pregnancy: Secondary | ICD-10-CM | POA: Diagnosis not present

## 2020-09-23 DIAGNOSIS — O4292 Full-term premature rupture of membranes, unspecified as to length of time between rupture and onset of labor: Principal | ICD-10-CM | POA: Diagnosis present

## 2020-09-23 DIAGNOSIS — O26893 Other specified pregnancy related conditions, third trimester: Secondary | ICD-10-CM | POA: Diagnosis not present

## 2020-09-23 DIAGNOSIS — O4290 Premature rupture of membranes, unspecified as to length of time between rupture and onset of labor, unspecified weeks of gestation: Secondary | ICD-10-CM | POA: Diagnosis not present

## 2020-09-23 DIAGNOSIS — Z20822 Contact with and (suspected) exposure to covid-19: Secondary | ICD-10-CM | POA: Diagnosis not present

## 2020-09-23 LAB — RESP PANEL BY RT-PCR (FLU A&B, COVID) ARPGX2
Influenza A by PCR: NEGATIVE
Influenza B by PCR: NEGATIVE
SARS Coronavirus 2 by RT PCR: NEGATIVE

## 2020-09-23 LAB — RPR: RPR Ser Ql: NONREACTIVE

## 2020-09-23 LAB — TYPE AND SCREEN
ABO/RH(D): O POS
Antibody Screen: NEGATIVE

## 2020-09-23 LAB — POCT FERN TEST: POCT Fern Test: POSITIVE

## 2020-09-23 LAB — CBC
HCT: 36.2 % (ref 36.0–46.0)
Hemoglobin: 12.1 g/dL (ref 12.0–15.0)
MCH: 28.4 pg (ref 26.0–34.0)
MCHC: 33.4 g/dL (ref 30.0–36.0)
MCV: 85 fL (ref 80.0–100.0)
Platelets: 335 10*3/uL (ref 150–400)
RBC: 4.26 MIL/uL (ref 3.87–5.11)
RDW: 13.7 % (ref 11.5–15.5)
WBC: 13.4 10*3/uL — ABNORMAL HIGH (ref 4.0–10.5)
nRBC: 0 % (ref 0.0–0.2)

## 2020-09-23 MED ORDER — COCONUT OIL OIL: 1.0000 "application " | TOPICAL_OIL | Status: DC | PRN

## 2020-09-23 MED ORDER — TETANUS-DIPHTH-ACELL PERTUSSIS 5-2.5-18.5 LF-MCG/0.5 IM SUSY: 0.5000 mL | PREFILLED_SYRINGE | Freq: Once | INTRAMUSCULAR | Status: DC

## 2020-09-23 MED ORDER — LIDOCAINE HCL (PF) 1 % IJ SOLN
30.0000 mL | INTRAMUSCULAR | Status: DC | PRN
Start: 2020-09-23 — End: 2020-09-23

## 2020-09-23 MED ORDER — PHENYLEPHRINE 40 MCG/ML (10ML) SYRINGE FOR IV PUSH (FOR BLOOD PRESSURE SUPPORT): 80.0000 ug | PREFILLED_SYRINGE | INTRAVENOUS | Status: DC | PRN

## 2020-09-23 MED ORDER — ONDANSETRON HCL 4 MG/2ML IJ SOLN: 4.0000 mg | Freq: Four times a day (QID) | INTRAMUSCULAR | Status: DC | PRN

## 2020-09-23 MED ORDER — IBUPROFEN 600 MG PO TABS
600.0000 mg | ORAL_TABLET | Freq: Four times a day (QID) | ORAL | Status: DC
  Administered 2020-09-23 – 2020-09-25 (×6): 600 mg via ORAL
  Filled 2020-09-23 (×6): qty 1

## 2020-09-23 MED ORDER — WITCH HAZEL-GLYCERIN EX PADS: 1.0000 "application " | MEDICATED_PAD | CUTANEOUS | Status: DC | PRN

## 2020-09-23 MED ORDER — EPHEDRINE 5 MG/ML INJ: 10.0000 mg | INTRAVENOUS | Status: DC | PRN

## 2020-09-23 MED ORDER — LACTATED RINGERS IV SOLN: 500.0000 mL | INTRAVENOUS | Status: DC | PRN

## 2020-09-23 MED ORDER — PHENYLEPHRINE 40 MCG/ML (10ML) SYRINGE FOR IV PUSH (FOR BLOOD PRESSURE SUPPORT)
80.0000 ug | PREFILLED_SYRINGE | INTRAVENOUS | Status: DC | PRN
  Filled 2020-09-23: qty 10

## 2020-09-23 MED ORDER — SODIUM CHLORIDE (PF) 0.9 % IJ SOLN
INTRAMUSCULAR | Status: DC | PRN
  Administered 2020-09-23: 12 mL/h via EPIDURAL

## 2020-09-23 MED ORDER — OXYTOCIN-SODIUM CHLORIDE 30-0.9 UT/500ML-% IV SOLN
1.0000 m[IU]/min | INTRAVENOUS | Status: DC
  Administered 2020-09-23: 2 m[IU]/min via INTRAVENOUS

## 2020-09-23 MED ORDER — PRENATAL MULTIVITAMIN CH
1.0000 | ORAL_TABLET | Freq: Every day | ORAL | Status: DC
  Administered 2020-09-24: 1 via ORAL
  Filled 2020-09-23: qty 1

## 2020-09-23 MED ORDER — BENZOCAINE-MENTHOL 20-0.5 % EX AERO
1.0000 "application " | INHALATION_SPRAY | CUTANEOUS | Status: DC | PRN
  Administered 2020-09-24: 1 via TOPICAL
  Filled 2020-09-23: qty 56

## 2020-09-23 MED ORDER — ACETAMINOPHEN 325 MG PO TABS
650.0000 mg | ORAL_TABLET | ORAL | Status: DC | PRN
  Administered 2020-09-24 – 2020-09-25 (×7): 650 mg via ORAL
  Filled 2020-09-23 (×7): qty 2

## 2020-09-23 MED ORDER — ONDANSETRON HCL 4 MG PO TABS: 4.0000 mg | ORAL_TABLET | ORAL | Status: DC | PRN

## 2020-09-23 MED ORDER — SOD CITRATE-CITRIC ACID 500-334 MG/5ML PO SOLN: 30.0000 mL | ORAL | Status: DC | PRN

## 2020-09-23 MED ORDER — OXYTOCIN-SODIUM CHLORIDE 30-0.9 UT/500ML-% IV SOLN
2.5000 [IU]/h | INTRAVENOUS | Status: DC
  Filled 2020-09-23: qty 500

## 2020-09-23 MED ORDER — SIMETHICONE 80 MG PO CHEW
80.0000 mg | CHEWABLE_TABLET | ORAL | Status: DC | PRN
Start: 2020-09-23 — End: 2020-09-25

## 2020-09-23 MED ORDER — TERBUTALINE SULFATE 1 MG/ML IJ SOLN: 0.2500 mg | Freq: Once | INTRAMUSCULAR | Status: DC | PRN

## 2020-09-23 MED ORDER — ZOLPIDEM TARTRATE 5 MG PO TABS
5.0000 mg | ORAL_TABLET | Freq: Every evening | ORAL | Status: DC | PRN
Start: 2020-09-23 — End: 2020-09-25

## 2020-09-23 MED ORDER — OXYTOCIN BOLUS FROM INFUSION: 333.0000 mL | Freq: Once | INTRAVENOUS | Status: DC

## 2020-09-23 MED ORDER — LACTATED RINGERS IV SOLN: INTRAVENOUS | Status: DC

## 2020-09-23 MED ORDER — OXYCODONE-ACETAMINOPHEN 5-325 MG PO TABS: 1.0000 | ORAL_TABLET | ORAL | Status: DC | PRN

## 2020-09-23 MED ORDER — DIBUCAINE (PERIANAL) 1 % EX OINT: 1.0000 "application " | TOPICAL_OINTMENT | CUTANEOUS | Status: DC | PRN

## 2020-09-23 MED ORDER — ONDANSETRON HCL 4 MG/2ML IJ SOLN: 4.0000 mg | INTRAMUSCULAR | Status: DC | PRN

## 2020-09-23 MED ORDER — LIDOCAINE HCL (PF) 1 % IJ SOLN
INTRAMUSCULAR | Status: DC | PRN
  Administered 2020-09-23: 6 mL via EPIDURAL
  Administered 2020-09-23: 4 mL via EPIDURAL

## 2020-09-23 MED ORDER — SENNOSIDES-DOCUSATE SODIUM 8.6-50 MG PO TABS
2.0000 | ORAL_TABLET | Freq: Every day | ORAL | Status: DC
  Administered 2020-09-24 – 2020-09-25 (×2): 2 via ORAL
  Filled 2020-09-23 (×2): qty 2

## 2020-09-23 MED ORDER — DIPHENHYDRAMINE HCL 50 MG/ML IJ SOLN: 12.5000 mg | INTRAMUSCULAR | Status: DC | PRN

## 2020-09-23 MED ORDER — LACTATED RINGERS IV SOLN
500.0000 mL | Freq: Once | INTRAVENOUS | Status: AC
  Administered 2020-09-23: 500 mL via INTRAVENOUS

## 2020-09-23 MED ORDER — FENTANYL-BUPIVACAINE-NACL 0.5-0.125-0.9 MG/250ML-% EP SOLN
12.0000 mL/h | EPIDURAL | Status: DC | PRN
  Filled 2020-09-23: qty 250

## 2020-09-23 MED ORDER — DIPHENHYDRAMINE HCL 25 MG PO CAPS: 25.0000 mg | ORAL_CAPSULE | Freq: Four times a day (QID) | ORAL | Status: DC | PRN

## 2020-09-23 MED ORDER — ACETAMINOPHEN 325 MG PO TABS: 650.0000 mg | ORAL_TABLET | ORAL | Status: DC | PRN

## 2020-09-23 MED ORDER — OXYCODONE-ACETAMINOPHEN 5-325 MG PO TABS: 2.0000 | ORAL_TABLET | ORAL | Status: DC | PRN

## 2020-09-23 NOTE — Anesthesia Preprocedure Evaluation (Signed)
Anesthesia Evaluation  Patient identified by MRN, date of birth, ID band Patient awake    Reviewed: Allergy & Precautions, H&P , NPO status , Patient's Chart, lab work & pertinent test results  History of Anesthesia Complications Negative for: history of anesthetic complications  Airway Mallampati: II  TM Distance: >3 FB Neck ROM: full    Dental no notable dental hx.    Pulmonary neg pulmonary ROS,    Pulmonary exam normal        Cardiovascular negative cardio ROS Normal cardiovascular exam Rhythm:regular Rate:Normal     Neuro/Psych negative neurological ROS     GI/Hepatic negative GI ROS, Neg liver ROS,   Endo/Other  negative endocrine ROS  Renal/GU negative Renal ROS  negative genitourinary   Musculoskeletal negative musculoskeletal ROS (+)   Abdominal   Peds  Hematology negative hematology ROS (+)   Anesthesia Other Findings   Reproductive/Obstetrics (+) Pregnancy                             Anesthesia Physical Anesthesia Plan  ASA: II  Anesthesia Plan: Epidural   Post-op Pain Management:    Induction:   PONV Risk Score and Plan:   Airway Management Planned:   Additional Equipment:   Intra-op Plan:   Post-operative Plan:   Informed Consent: I have reviewed the patients History and Physical, chart, labs and discussed the procedure including the risks, benefits and alternatives for the proposed anesthesia with the patient or authorized representative who has indicated his/her understanding and acceptance.       Plan Discussed with:   Anesthesia Plan Comments:         Anesthesia Quick Evaluation

## 2020-09-23 NOTE — MAU Note (Signed)
Leaking fld since about 0400. Fld is clear. No contractions.

## 2020-09-23 NOTE — Progress Notes (Signed)
Cervix unchanged, patient desires epidural, category 1 tracing. Initiate pitocin for augmentation at this time BP 100/77   Pulse 65   Temp 97.9 F (36.6 C)   Resp 16   Ht 4\' 11"  (1.499 m)   Wt 73.5 kg   BMI 32.72 kg/m

## 2020-09-23 NOTE — Progress Notes (Signed)
Labor Note  S: s/p epidural, some vaginal pressure  O: BP 131/71   Pulse 63   Temp 98.5 F (36.9 C) (Oral)   Resp 16   Ht 4\' 11"  (1.499 m)   Wt 73.5 kg   SpO2 100%   BMI 32.72 kg/m  CE: 3-4/90/-1 FHR: Baseline 130, +accels, -decels, min to modvariability TOCO q2-6, pitocin at 35mU/min  A/P: This is a 25 y.o. G3P1011 at [redacted]w[redacted]d  admitted for PROM FWB: cat 1 MWB: s/p epidural Labor course: latent labor, continue to titrate  Anticipate SVD

## 2020-09-23 NOTE — Progress Notes (Signed)
DR Meisinger in OR on L&D. Made aware of pt in MAU who has come in with SROM. Will admit to BS.
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
## 2020-09-23 NOTE — Anesthesia Postprocedure Evaluation (Signed)
Anesthesia Post Note  Patient: Diana Farrell  Procedure(s) Performed: AN AD HOC LABOR EPIDURAL     Patient location during evaluation: Mother Baby Anesthesia Type: Epidural Level of consciousness: awake and alert Pain management: pain level controlled Vital Signs Assessment: post-procedure vital signs reviewed and stable Respiratory status: spontaneous breathing, nonlabored ventilation and respiratory function stable Cardiovascular status: stable Postop Assessment: no headache, no backache and epidural receding Anesthetic complications: no   No complications documented.  Last Vitals:  Vitals:   09/23/20 2000 09/23/20 2015  BP: 123/71 130/70  Pulse: 61 (!) 57  Resp: 18 18  Temp:    SpO2:      Last Pain:  Vitals:   09/23/20 2000  TempSrc:   PainSc: 0-No pain   Pain Goal: Patients Stated Pain Goal: 9 (09/23/20 1405)              Epidural/Spinal Function Cutaneous sensation: Able to Wiggle Toes (09/23/20 2000), Patient able to flex knees: No (09/23/20 2000), Patient able to lift hips off bed: No (09/23/20 2000), Back pain beyond tenderness at insertion site: No (09/23/20 2000), Progressively worsening motor and/or sensory loss: No (09/23/20 2000), Bowel and/or bladder incontinence post epidural: No (09/23/20 2000)  ODDONO,ERNEST

## 2020-09-23 NOTE — Progress Notes (Signed)
Patient with increased pelvic pressure, posterior lip/0 station, anticipate SVD soon  BP 133/65   Pulse 73   Temp 98.7 F (37.1 C) (Oral)   Resp 18   Ht 4\' 11"  (1.499 m)   Wt 73.5 kg   SpO2 100%   BMI 32.72 kg/m

## 2020-09-23 NOTE — Anesthesia Procedure Notes (Signed)
Epidural Patient location during procedure: OB Start time: 09/23/2020 2:22 PM End time: 09/23/2020 2:34 PM  Staffing Anesthesiologist: Lucretia Kern, MD Performed: anesthesiologist   Preanesthetic Checklist Completed: patient identified, IV checked, risks and benefits discussed, monitors and equipment checked, pre-op evaluation and timeout performed  Epidural Patient position: sitting Prep: DuraPrep Patient monitoring: heart rate, continuous pulse ox and blood pressure Approach: midline Location: L3-L4 Injection technique: LOR air  Needle:  Needle type: Tuohy  Needle gauge: 17 G Needle length: 9 cm Needle insertion depth: 6 cm Catheter type: closed end flexible Catheter size: 19 Gauge Catheter at skin depth: 11 cm Test dose: negative  Assessment Events: blood not aspirated, injection not painful, no injection resistance, no paresthesia and negative IV test  Additional Notes Reason for block:procedure for pain

## 2020-09-24 LAB — CBC
HCT: 31.3 % — ABNORMAL LOW (ref 36.0–46.0)
Hemoglobin: 10 g/dL — ABNORMAL LOW (ref 12.0–15.0)
MCH: 27.5 pg (ref 26.0–34.0)
MCHC: 31.9 g/dL (ref 30.0–36.0)
MCV: 86.2 fL (ref 80.0–100.0)
Platelets: 249 10*3/uL (ref 150–400)
RBC: 3.63 MIL/uL — ABNORMAL LOW (ref 3.87–5.11)
RDW: 14 % (ref 11.5–15.5)
WBC: 13.7 10*3/uL — ABNORMAL HIGH (ref 4.0–10.5)
nRBC: 0 % (ref 0.0–0.2)

## 2020-09-24 NOTE — Anesthesia Postprocedure Evaluation (Signed)
Anesthesia Post Note  Patient: Luceal Hollibaugh  Procedure(s) Performed: AN AD HOC LABOR EPIDURAL     Patient location during evaluation: Mother Baby Anesthesia Type: Epidural Level of consciousness: awake and alert, oriented and patient cooperative Pain management: pain level controlled Vital Signs Assessment: post-procedure vital signs reviewed and stable Respiratory status: spontaneous breathing Cardiovascular status: stable Postop Assessment: no headache, epidural receding, patient able to bend at knees and no signs of nausea or vomiting Anesthetic complications: no Comments: Pt. States she is walking.  Pain score 8.  RN present at bedside to assess pain needs.    No complications documented.  Last Vitals:  Vitals:   09/24/20 0100 09/24/20 0504  BP: 99/65 107/71  Pulse: 60 (!) 58  Resp: 16 18  Temp: 36.8 C 36.4 C  SpO2: 97% 98%    Last Pain:  Vitals:   09/24/20 0504  TempSrc: Axillary  PainSc: 8    Pain Goal: Patients Stated Pain Goal: 3 (09/24/20 0024)                 Merrilyn Puma

## 2020-09-24 NOTE — Discharge Instructions (Signed)
Call office with any concerns [phone] 

## 2020-09-24 NOTE — Progress Notes (Signed)
Post Partum Day 1 Subjective: no complaints, up ad lib, voiding, tolerating PO, + flatus and Pain well controlled. Occasional cramping. Lochia mild. She is bottlefeeding and bonding well with baby  Objective: Blood pressure 107/71, pulse (!) 58, temperature 97.6 F (36.4 C), temperature source Axillary, resp. rate 18, height 4\' 11"  (1.499 m), weight 73.5 kg, SpO2 98 %, unknown if currently breastfeeding.  Physical Exam:  General: alert, cooperative and no distress Lochia: appropriate Uterine Fundus: firm Incision: n/a DVT Evaluation: No evidence of DVT seen on physical exam. No significant calf/ankle edema.  Recent Labs    09/23/20 0759 09/24/20 0646  HGB 12.1 10.0*  HCT 36.2 31.3*    Assessment/Plan: Plan for discharge tomorrow  Routine pp care   LOS: 1 day   Diana Farrell W Ruther Ephraim 09/24/2020, 3:41 PM

## 2020-09-25 ENCOUNTER — Other Ambulatory Visit (HOSPITAL_COMMUNITY): Payer: Medicaid Other

## 2020-09-25 MED ORDER — IBUPROFEN 600 MG PO TABS: 600.0000 mg | ORAL_TABLET | Freq: Four times a day (QID) | ORAL | 0 refills | Status: DC

## 2020-09-25 NOTE — Discharge Summary (Signed)
Postpartum Discharge Summary       Patient Name: Diana Farrell DOB: 1996/05/09 MRN: [id number]  Date of admission: 09/23/2020 Delivery date:09/23/2020  Delivering provider: Carlisle Cater  Date of discharge: 09/25/2020  Admitting diagnosis: PROM (premature rupture of membranes) [O42.90] Intrauterine pregnancy: [redacted]w[redacted]d     Secondary diagnosis:  Active Problems:   PROM (premature rupture of membranes)  Additional problems: none    Discharge diagnosis: Term Pregnancy Delivered                                              Post partum procedures:NONE Augmentation: Pitocin Complications: None  Hospital course: Onset of Labor With Vaginal Delivery      25 y.o. yo [id number] at [redacted]w[redacted]d was admitted in Latent Labor on 09/23/2020. Patient had an uncomplicated labor course as follows:  Membrane Rupture Time/Date: 4:30 AM ,09/23/2020   Delivery Method:Vaginal, Spontaneous  Episiotomy: None  Lacerations:  None  Patient had an uncomplicated postpartum course.  She is ambulating, tolerating a regular diet, passing flatus, and urinating well. Patient is discharged home in stable condition on 09/25/20.  Newborn Data: Birth date:09/23/2020  Birth time:6:38 PM  Gender:Female  Living status:Living  Apgars:8 ,9  Weight:2870 g   Magnesium Sulfate received: No BMZ received: No Rhophylac:No   Physical exam  Vitals:   09/24/20 0504 09/24/20 0918 09/24/20 2245 09/25/20 0540  BP: 107/71 104/68 113/77 108/84  Pulse: (!) 58 61 65 62  Resp: 18 18 18 16   Temp: 97.6 F (36.4 C) 98 F (36.7 C) 97.8 F (36.6 C) 98.4 F (36.9 C)  TempSrc: Axillary  Oral Oral  SpO2: 98% 98%  100%  Weight:      Height:       General: alert and cooperative Lochia: appropriate Uterine Fundus: firm  Labs: Lab Results  Component Value Date   WBC 13.7 (H) 09/24/2020   HGB 10.0 (L) 09/24/2020   HCT 31.3 (L) 09/24/2020   MCV 86.2 09/24/2020   PLT 249 09/24/2020   CMP Latest Ref Rng & Units 11/17/2019   Glucose 70 - 99 mg/dL 81  BUN 6 - 20 mg/dL 13  Creatinine 11/19/2019 - 4.58 mg/dL 0.99  Sodium 8.33 - 825 mmol/L 141  Potassium 3.5 - 5.1 mmol/L 3.5  Chloride 98 - 111 mmol/L 106  CO2 22 - 32 mmol/L -  Calcium 8.9 - 10.3 mg/dL -  Total Protein 6.5 - 8.1 g/dL -  Total Bilirubin 0.3 - 1.2 mg/dL -  Alkaline Phos 38 - 053 U/L -  AST 15 - 41 U/L -  ALT 0 - 44 U/L -   Edinburgh Score: Edinburgh Postnatal Depression Scale Screening Tool 09/24/2020  I have been able to laugh and see the funny side of things. 0  I have looked forward with enjoyment to things. 1  I have blamed myself unnecessarily when things went wrong. 2  I have been anxious or worried for no good reason. 1  I have felt scared or panicky for no good reason. 0  Things have been getting on top of me. 0  I have been so unhappy that I have had difficulty sleeping. 0  I have felt sad or miserable. 0  I have been so unhappy that I have been crying. 0  The thought of harming myself has occurred to me. 0  Edinburgh Postnatal Depression  Scale Total 4     After visit meds:  Allergies as of 09/25/2020   No Known Allergies     Medication List    STOP taking these medications   calcium carbonate 500 MG chewable tablet Commonly known as: TUMS - dosed in mg elemental calcium     TAKE these medications   acetaminophen 325 MG tablet Commonly known as: TYLENOL Take 650 mg by mouth every 6 (six) hours as needed.   amphetamine-dextroamphetamine 10 MG tablet Commonly known as: ADDERALL Take 10 mg by mouth 2 (two) times daily as needed (to focus).   FLUoxetine 20 MG tablet Commonly known as: PROZAC Take 20 mg by mouth daily.   ibuprofen 600 MG tablet Commonly known as: ADVIL Take 1 tablet (600 mg total) by mouth every 6 (six) hours.   prenatal multivitamin Tabs tablet Take 1 tablet by mouth daily at 12 noon.        Discharge home in stable condition Infant Feeding: Bottle Infant Disposition:home with mother Discharge  instruction: per After Visit Summary and Postpartum booklet. Activity: Advance as tolerated. Pelvic rest for 6 weeks.  Diet: routine diet Future Appointments:No future appointments. Follow up Visit:  Follow-up Information    Shivaji, Valerie Roys, MD. Schedule an appointment as soon as possible for a visit in 6 week(s).   Specialty: Obstetrics and Gynecology Why: For postpartum vsiit Contact information: [address] [place] [address] [place] [address] [phone]                Please schedule this patient for a In person postpartum visit in 6 weeks with the following provider: MD.  Delivery mode:  Vaginal, Spontaneous  Anticipated Birth Control:  OCPs   09/25/2020 Oliver Pila, MD

## 2020-09-25 NOTE — Progress Notes (Signed)
Post Partum Day 2 Subjective: no complaints, up ad lib and tolerating PO  Objective: Blood pressure 108/84, pulse 62, temperature 98.4 F (36.9 C), temperature source Oral, resp. rate 16, height 4\' 11"  (1.499 m), weight 73.5 kg, SpO2 100 %, unknown if currently breastfeeding.  Physical Exam:  General: alert and cooperative Lochia: appropriate Uterine Fundus: firm   Recent Labs    09/23/20 0759 09/24/20 0646  HGB 12.1 10.0*  HCT 36.2 31.3*    Assessment/Plan: Discharge home  Bottlefeeding going well.   LOS: 2 days   09/26/20 09/25/2020, 9:28 AM

## 2020-09-27 ENCOUNTER — Inpatient Hospital Stay (HOSPITAL_COMMUNITY)
Admission: AD | Admit: 2020-09-27 | Payer: Medicaid Other | Source: Home / Self Care | Admitting: Obstetrics and Gynecology

## 2020-09-27 ENCOUNTER — Inpatient Hospital Stay (HOSPITAL_COMMUNITY): Payer: Medicaid Other

## 2020-09-28 DIAGNOSIS — F411 Generalized anxiety disorder: Secondary | ICD-10-CM | POA: Diagnosis not present

## 2020-09-28 DIAGNOSIS — F909 Attention-deficit hyperactivity disorder, unspecified type: Secondary | ICD-10-CM | POA: Diagnosis not present

## 2020-10-05 DIAGNOSIS — Z419 Encounter for procedure for purposes other than remedying health state, unspecified: Secondary | ICD-10-CM | POA: Diagnosis not present

## 2020-11-01 DIAGNOSIS — F909 Attention-deficit hyperactivity disorder, unspecified type: Secondary | ICD-10-CM | POA: Diagnosis not present

## 2020-11-01 DIAGNOSIS — F419 Anxiety disorder, unspecified: Secondary | ICD-10-CM | POA: Diagnosis not present

## 2020-11-02 DIAGNOSIS — Z419 Encounter for procedure for purposes other than remedying health state, unspecified: Secondary | ICD-10-CM | POA: Diagnosis not present

## 2020-12-03 DIAGNOSIS — Z419 Encounter for procedure for purposes other than remedying health state, unspecified: Secondary | ICD-10-CM | POA: Diagnosis not present

## 2020-12-09 DIAGNOSIS — R52 Pain, unspecified: Secondary | ICD-10-CM | POA: Diagnosis not present

## 2020-12-09 DIAGNOSIS — B001 Herpesviral vesicular dermatitis: Secondary | ICD-10-CM | POA: Diagnosis not present

## 2020-12-21 DIAGNOSIS — R52 Pain, unspecified: Secondary | ICD-10-CM | POA: Diagnosis not present

## 2020-12-21 DIAGNOSIS — F419 Anxiety disorder, unspecified: Secondary | ICD-10-CM | POA: Diagnosis not present

## 2020-12-21 DIAGNOSIS — J3489 Other specified disorders of nose and nasal sinuses: Secondary | ICD-10-CM | POA: Diagnosis not present

## 2020-12-21 DIAGNOSIS — F909 Attention-deficit hyperactivity disorder, unspecified type: Secondary | ICD-10-CM | POA: Diagnosis not present

## 2021-01-02 DIAGNOSIS — Z419 Encounter for procedure for purposes other than remedying health state, unspecified: Secondary | ICD-10-CM | POA: Diagnosis not present

## 2021-02-02 DIAGNOSIS — Z419 Encounter for procedure for purposes other than remedying health state, unspecified: Secondary | ICD-10-CM | POA: Diagnosis not present

## 2021-03-02 DIAGNOSIS — Z30011 Encounter for initial prescription of contraceptive pills: Secondary | ICD-10-CM | POA: Diagnosis not present

## 2021-03-04 DIAGNOSIS — Z419 Encounter for procedure for purposes other than remedying health state, unspecified: Secondary | ICD-10-CM | POA: Diagnosis not present

## 2021-04-04 DIAGNOSIS — Z419 Encounter for procedure for purposes other than remedying health state, unspecified: Secondary | ICD-10-CM | POA: Diagnosis not present

## 2021-04-19 DIAGNOSIS — Z3689 Encounter for other specified antenatal screening: Secondary | ICD-10-CM | POA: Diagnosis not present

## 2021-04-19 DIAGNOSIS — Z368A Encounter for antenatal screening for other genetic defects: Secondary | ICD-10-CM | POA: Diagnosis not present

## 2021-04-19 DIAGNOSIS — Z3481 Encounter for supervision of other normal pregnancy, first trimester: Secondary | ICD-10-CM | POA: Diagnosis not present

## 2021-04-19 DIAGNOSIS — O26891 Other specified pregnancy related conditions, first trimester: Secondary | ICD-10-CM | POA: Diagnosis not present

## 2021-04-19 DIAGNOSIS — Z363 Encounter for antenatal screening for malformations: Secondary | ICD-10-CM | POA: Diagnosis not present

## 2021-04-19 DIAGNOSIS — Z113 Encounter for screening for infections with a predominantly sexual mode of transmission: Secondary | ICD-10-CM | POA: Diagnosis not present

## 2021-04-19 DIAGNOSIS — Z3A10 10 weeks gestation of pregnancy: Secondary | ICD-10-CM | POA: Diagnosis not present

## 2021-04-19 LAB — OB RESULTS CONSOLE GC/CHLAMYDIA
Chlamydia: NEGATIVE
Gonorrhea: NEGATIVE

## 2021-04-19 LAB — HEPATITIS C ANTIBODY: HCV Ab: NEGATIVE

## 2021-04-19 LAB — OB RESULTS CONSOLE HEPATITIS B SURFACE ANTIGEN: Hepatitis B Surface Ag: NEGATIVE

## 2021-04-19 LAB — OB RESULTS CONSOLE HIV ANTIBODY (ROUTINE TESTING): HIV: NONREACTIVE

## 2021-04-19 LAB — OB RESULTS CONSOLE RPR: RPR: NONREACTIVE

## 2021-04-19 LAB — OB RESULTS CONSOLE RUBELLA ANTIBODY, IGM: Rubella: IMMUNE

## 2021-04-26 DIAGNOSIS — F909 Attention-deficit hyperactivity disorder, unspecified type: Secondary | ICD-10-CM | POA: Diagnosis not present

## 2021-04-26 DIAGNOSIS — F419 Anxiety disorder, unspecified: Secondary | ICD-10-CM | POA: Diagnosis not present

## 2021-05-03 DIAGNOSIS — L659 Nonscarring hair loss, unspecified: Secondary | ICD-10-CM | POA: Diagnosis not present

## 2021-05-03 DIAGNOSIS — Z3481 Encounter for supervision of other normal pregnancy, first trimester: Secondary | ICD-10-CM | POA: Diagnosis not present

## 2021-05-05 DIAGNOSIS — Z419 Encounter for procedure for purposes other than remedying health state, unspecified: Secondary | ICD-10-CM | POA: Diagnosis not present

## 2021-06-04 DIAGNOSIS — Z419 Encounter for procedure for purposes other than remedying health state, unspecified: Secondary | ICD-10-CM | POA: Diagnosis not present

## 2021-06-15 DIAGNOSIS — O99342 Other mental disorders complicating pregnancy, second trimester: Secondary | ICD-10-CM | POA: Diagnosis not present

## 2021-06-15 DIAGNOSIS — Z363 Encounter for antenatal screening for malformations: Secondary | ICD-10-CM | POA: Diagnosis not present

## 2021-06-15 DIAGNOSIS — Z3A18 18 weeks gestation of pregnancy: Secondary | ICD-10-CM | POA: Diagnosis not present

## 2021-06-16 DIAGNOSIS — F900 Attention-deficit hyperactivity disorder, predominantly inattentive type: Secondary | ICD-10-CM | POA: Diagnosis not present

## 2021-07-05 DIAGNOSIS — Z419 Encounter for procedure for purposes other than remedying health state, unspecified: Secondary | ICD-10-CM | POA: Diagnosis not present

## 2021-07-14 DIAGNOSIS — Z362 Encounter for other antenatal screening follow-up: Secondary | ICD-10-CM | POA: Diagnosis not present

## 2021-07-14 DIAGNOSIS — Z3482 Encounter for supervision of other normal pregnancy, second trimester: Secondary | ICD-10-CM | POA: Diagnosis not present

## 2021-07-14 DIAGNOSIS — F900 Attention-deficit hyperactivity disorder, predominantly inattentive type: Secondary | ICD-10-CM | POA: Diagnosis not present

## 2021-07-14 DIAGNOSIS — Z3A22 22 weeks gestation of pregnancy: Secondary | ICD-10-CM | POA: Diagnosis not present

## 2021-07-14 DIAGNOSIS — F819 Developmental disorder of scholastic skills, unspecified: Secondary | ICD-10-CM | POA: Diagnosis not present

## 2021-08-04 DIAGNOSIS — Z419 Encounter for procedure for purposes other than remedying health state, unspecified: Secondary | ICD-10-CM | POA: Diagnosis not present
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
## 2021-08-08 ENCOUNTER — Other Ambulatory Visit: Payer: Self-pay

## 2021-08-08 ENCOUNTER — Encounter (HOSPITAL_COMMUNITY): Payer: Self-pay | Admitting: Emergency Medicine

## 2021-08-08 ENCOUNTER — Inpatient Hospital Stay (HOSPITAL_COMMUNITY)
Admission: AD | Admit: 2021-08-08 | Discharge: 2021-08-09 | Disposition: A | Discharge status: Home / Self Care | Payer: Medicaid Other | Attending: Obstetrics and Gynecology | Admitting: Obstetrics and Gynecology

## 2021-08-08 DIAGNOSIS — U071 COVID-19: Secondary | ICD-10-CM | POA: Insufficient documentation

## 2021-08-08 DIAGNOSIS — O98512 Other viral diseases complicating pregnancy, second trimester: Secondary | ICD-10-CM | POA: Insufficient documentation

## 2021-08-08 DIAGNOSIS — Z3A26 26 weeks gestation of pregnancy: Secondary | ICD-10-CM | POA: Insufficient documentation

## 2021-08-08 DIAGNOSIS — O26892 Other specified pregnancy related conditions, second trimester: Secondary | ICD-10-CM | POA: Diagnosis present

## 2021-08-08 DIAGNOSIS — R519 Headache, unspecified: Secondary | ICD-10-CM

## 2021-08-08 LAB — RESP PANEL BY RT-PCR (FLU A&B, COVID) ARPGX2
Influenza A by PCR: NEGATIVE
Influenza B by PCR: NEGATIVE
SARS Coronavirus 2 by RT PCR: POSITIVE — AB

## 2021-08-08 MED ORDER — ACETAMINOPHEN 500 MG PO TABS
1000.0000 mg | ORAL_TABLET | Freq: Four times a day (QID) | ORAL | Status: DC | PRN
  Administered 2021-08-09: 1000 mg via ORAL
  Filled 2021-08-08: qty 2

## 2021-08-08 NOTE — ED Triage Notes (Signed)
Pt presents to ED Pov. Pt c/o bodyaches, tactile fever, otalgia, and URI s/s that began today. Pt reports being around flu+ person last week. No tylenol or ibuprofen. Pt reports 87m pregnant EDD 01/12/2022. Pt reports normal pregnancy and feeling the baby move like normal today.

## 2021-08-08 NOTE — ED Provider Notes (Signed)
Emergency Medicine Provider Triage Evaluation Note  Diana Farrell , a 25 y.o. female  was evaluated in triage.  Pt complains of flu like symptoms since 10am this morning. Patient does have positive sick contacts (her father). Patient is [redacted] weeks pregnant and does have consistent prenatal care.   Review of Systems  Positive: Body aches, chills, cough, sore throat, headache, nausea, shortness of breath Negative: Vomiting, fevers, abdominal pain  Physical Exam  BP 105/61 (BP Location: Right Arm)   Pulse (!) 115   Temp 99.6 F (37.6 C) (Oral)   Resp 18   SpO2 99%  Gen:   Awake, no distress, ill appearing  Resp:  Normal effort, no wheezing  MSK:   Moves extremities without difficulty  Other:    Medical Decision Making  Medically screening exam initiated at 9:16 PM.  Appropriate orders placed.  Diana Farrell was informed that the remainder of the evaluation will be completed by another provider, this initial triage assessment does not replace that evaluation, and the importance of remaining in the ED until their evaluation is complete.  Spoke with Melanie at [facility] and patient will be accepted there.    Al Decant, PA-C 08/08/21 2121    Jacalyn Lefevre, MD 08/08/21 2229

## 2021-08-08 NOTE — MAU Provider Note (Signed)
History     CSN: [id number]  Arrival date and time: 08/08/21 2138   Chief Complaint  Patient presents with   Generalized Body Aches   25 y.o. [id number] @26 .2 wks presenting with body aches, HA, congestion, chills and ear pain. Reports onset this am. Reports sick contacts to flu. Rates HA 10/10. Has not treated with meds. Reports good FM. No pregnancy complaints.    OB History     Gravida  4   Para  2   Term  2   Preterm      AB  1   Living  2      SAB      IAB  1   Ectopic      Multiple  0   Live Births  2           Past Medical History:  Diagnosis Date   Anemia, antepartum(648.23)    Other abscess of vulva    SVD (spontaneous vaginal delivery) 08/05/2013    Past Surgical History:  Procedure Laterality Date   APPENDECTOMY     LAPAROSCOPIC APPENDECTOMY N/A 02/14/2019   Procedure: APPENDECTOMY LAPAROSCOPIC;  Surgeon: 04/16/2019, MD;  Location: [facility] OR;  Service: General;  Laterality: N/A;   WISDOM TOOTH EXTRACTION      Family History  Adopted: Yes  Family history unknown: Yes    Social History   Tobacco Use   Smoking status: Never   Smokeless tobacco: Never  Vaping Use   Vaping Use: Never used  Substance Use Topics   Alcohol use: No   Drug use: No    Allergies: No Known Allergies  Medications Prior to Admission  Medication Sig Dispense Refill Last Dose   amphetamine-dextroamphetamine (ADDERALL) 10 MG tablet Take 10 mg by mouth 2 (two) times daily as needed (to focus).    08/07/2021   FLUoxetine (PROZAC) 20 MG tablet Take 20 mg by mouth daily.   08/07/2021   Prenatal Vit-Fe Fumarate-FA (PRENATAL MULTIVITAMIN) TABS tablet Take 1 tablet by mouth daily at 12 noon.   08/07/2021   acetaminophen (TYLENOL) 325 MG tablet Take 650 mg by mouth every 6 (six) hours as needed.      ibuprofen (ADVIL) 600 MG tablet Take 1 tablet (600 mg total) by mouth every 6 (six) hours. 30 tablet 0     Review of Systems  Constitutional:  Positive for chills.  Negative for fever.  HENT:  Positive for congestion, ear pain and sore throat.   Respiratory:  Negative for shortness of breath.   Cardiovascular:  Negative for chest pain.  Gastrointestinal:  Negative for abdominal pain.  Genitourinary:  Negative for vaginal bleeding.  Musculoskeletal:  Positive for myalgias.  Neurological:  Positive for headaches.  Physical Exam   Blood pressure 105/61, pulse (!) 115, temperature 98.8 F (37.1 C), resp. rate 18, SpO2 97 %, unknown if currently breastfeeding.  Physical Exam Vitals and nursing note reviewed.  Constitutional:      General: She is not in acute distress.    Appearance: Normal appearance.  HENT:     Head: Normocephalic and atraumatic.  Cardiovascular:     Rate and Rhythm: Regular rhythm. Tachycardia present.     Heart sounds: Normal heart sounds.  Pulmonary:     Effort: Pulmonary effort is normal. No respiratory distress.     Breath sounds: Normal breath sounds. No stridor. No wheezing, rhonchi or rales.  Musculoskeletal:        General: Normal range of motion.  Cervical back: Normal range of motion.  Skin:    General: Skin is warm and dry.  Neurological:     General: No focal deficit present.     Mental Status: She is alert and oriented to person, place, and time.  Psychiatric:        Mood and Affect: Mood normal.        Behavior: Behavior normal.  EFM: 150 bpm, mod variability, + accels, rare variable decels Toco: none  Results for orders placed or performed during the hospital encounter of 08/08/21 (from the past 24 hour(s))  Resp Panel by RT-PCR (Flu A&B, Covid) Nasopharyngeal Swab     Status: Abnormal   Collection Time: 08/08/21  9:08 PM   Specimen: Nasopharyngeal Swab; Nasopharyngeal(NP) swabs in vial transport medium  Result Value Ref Range   SARS Coronavirus 2 by RT PCR POSITIVE (A) NEGATIVE   Influenza A by PCR NEGATIVE NEGATIVE   Influenza B by PCR NEGATIVE NEGATIVE   MAU Course   Procedures Tylenol  MDM Labs reviewed. +Covid. Discussed home treatment and sx mngt. Stable for discharge home.   Assessment and Plan   1. [redacted] weeks gestation of pregnancy   2. Acute nonintractable headache, unspecified headache type   3. COVID-19 affecting pregnancy in second trimester    Discharge home Follow up at [facility] as scheduled OTC med list provided Return precautions OOW note provided  Allergies as of 08/09/2021   No Known Allergies      Medication List     STOP taking these medications    amphetamine-dextroamphetamine 10 MG tablet Commonly known as: ADDERALL   ibuprofen 600 MG tablet Commonly known as: ADVIL       TAKE these medications    acetaminophen 325 MG tablet Commonly known as: TYLENOL Take 650 mg by mouth every 6 (six) hours as needed.   FLUoxetine 20 MG tablet Commonly known as: PROZAC Take 20 mg by mouth daily.   prenatal multivitamin Tabs tablet Take 1 tablet by mouth daily at 12 noon.        Donette Larry, CNM 08/08/2021, 11:58 PM

## 2021-08-08 NOTE — MAU Note (Incomplete)
Pt reports she started having body ache this morning. C/o headache and chills.and ear pain. Went to [facility] and sent to [facility]. Covid test is positive.

## 2021-08-09 DIAGNOSIS — U071 COVID-19: Secondary | ICD-10-CM

## 2021-08-09 DIAGNOSIS — Z3A26 26 weeks gestation of pregnancy: Secondary | ICD-10-CM

## 2021-08-09 DIAGNOSIS — O98512 Other viral diseases complicating pregnancy, second trimester: Secondary | ICD-10-CM | POA: Diagnosis not present

## 2021-08-09 NOTE — Discharge Instructions (Signed)

## 2021-08-15 DIAGNOSIS — Z3689 Encounter for other specified antenatal screening: Secondary | ICD-10-CM | POA: Diagnosis not present

## 2021-09-04 DIAGNOSIS — Z419 Encounter for procedure for purposes other than remedying health state, unspecified: Secondary | ICD-10-CM | POA: Diagnosis not present

## 2021-09-04 NOTE — L&D Delivery Note (Signed)
Delivery Note Diana Farrell is a [id number] at [redacted]w[redacted]d who had a spontaneous delivery at 0610 a viable female Diana Ripper") was delivered via LOA.  APGAR: 7, 9; weight 5lb 14.7oz (4782N)  .     Admitted for rupture of membranes in early labor. Augmented with pitocin. Progressed normally. Received epidural for pain management. Pushed for 1 minute. Baby was delivered without difficulty. No nuchal cord.  Delayed cord clamping for 60 seconds.  Delivery of placenta was spontaneous. Placenta was found to be intact, 3 -vessel cord was noted. The fundus was found to be firm. Perineum intact. Estimated blood loss 150cc. Instrument and gauze counts were correct at the end of the procedure.   Placenta status: to L&D for disposal  Anesthesia:  epidural Episiotomy:  none Lacerations:  none Suture Repair: N/A Est. Blood Loss (mL):  150  Mom to postpartum.  Baby to Couplet care / Skin to Skin.  Diana Farrell 10/30/2021, 6:32 AM

## 2021-09-18 ENCOUNTER — Encounter (HOSPITAL_COMMUNITY): Payer: Self-pay | Admitting: Obstetrics and Gynecology

## 2021-09-18 ENCOUNTER — Other Ambulatory Visit: Payer: Self-pay

## 2021-09-18 ENCOUNTER — Inpatient Hospital Stay (HOSPITAL_COMMUNITY)
Admission: AD | Admit: 2021-09-18 | Discharge: 2021-09-18 | Disposition: A | Discharge status: Home / Self Care | Payer: Medicaid Other | Attending: Obstetrics and Gynecology | Admitting: Obstetrics and Gynecology

## 2021-09-18 DIAGNOSIS — N390 Urinary tract infection, site not specified: Secondary | ICD-10-CM | POA: Insufficient documentation

## 2021-09-18 DIAGNOSIS — O26893 Other specified pregnancy related conditions, third trimester: Secondary | ICD-10-CM | POA: Diagnosis not present

## 2021-09-18 DIAGNOSIS — R110 Nausea: Secondary | ICD-10-CM | POA: Insufficient documentation

## 2021-09-18 DIAGNOSIS — Z3A32 32 weeks gestation of pregnancy: Secondary | ICD-10-CM | POA: Diagnosis not present

## 2021-09-18 DIAGNOSIS — R519 Headache, unspecified: Secondary | ICD-10-CM | POA: Insufficient documentation

## 2021-09-18 DIAGNOSIS — R309 Painful micturition, unspecified: Secondary | ICD-10-CM | POA: Insufficient documentation

## 2021-09-18 DIAGNOSIS — Z20822 Contact with and (suspected) exposure to covid-19: Secondary | ICD-10-CM | POA: Insufficient documentation

## 2021-09-18 DIAGNOSIS — O2343 Unspecified infection of urinary tract in pregnancy, third trimester: Secondary | ICD-10-CM | POA: Insufficient documentation

## 2021-09-18 LAB — RESP PANEL BY RT-PCR (FLU A&B, COVID) ARPGX2
Influenza A by PCR: NEGATIVE
Influenza B by PCR: NEGATIVE
SARS Coronavirus 2 by RT PCR: NEGATIVE

## 2021-09-18 LAB — CBC WITH DIFFERENTIAL/PLATELET
Abs Immature Granulocytes: 0.04 10*3/uL (ref 0.00–0.07)
Basophils Absolute: 0 10*3/uL (ref 0.0–0.1)
Basophils Relative: 0 %
Eosinophils Absolute: 0 10*3/uL (ref 0.0–0.5)
Eosinophils Relative: 0 %
HCT: 30.3 % — ABNORMAL LOW (ref 36.0–46.0)
Hemoglobin: 10.1 g/dL — ABNORMAL LOW (ref 12.0–15.0)
Immature Granulocytes: 1 %
Lymphocytes Relative: 13 %
Lymphs Abs: 0.9 10*3/uL (ref 0.7–4.0)
MCH: 26.6 pg (ref 26.0–34.0)
MCHC: 33.3 g/dL (ref 30.0–36.0)
MCV: 79.7 fL — ABNORMAL LOW (ref 80.0–100.0)
Monocytes Absolute: 0.3 10*3/uL (ref 0.1–1.0)
Monocytes Relative: 5 %
Neutro Abs: 5.5 10*3/uL (ref 1.7–7.7)
Neutrophils Relative %: 81 %
Platelets: 287 10*3/uL (ref 150–400)
RBC: 3.8 MIL/uL — ABNORMAL LOW (ref 3.87–5.11)
RDW: 14.4 % (ref 11.5–15.5)
WBC: 6.7 10*3/uL (ref 4.0–10.5)
nRBC: 0 % (ref 0.0–0.2)

## 2021-09-18 LAB — URINALYSIS, ROUTINE W REFLEX MICROSCOPIC
Glucose, UA: NEGATIVE mg/dL
Hgb urine dipstick: NEGATIVE
Ketones, ur: >80 mg/dL — AB
Nitrite: POSITIVE — AB
Protein, ur: 30 mg/dL — AB
Specific Gravity, Urine: >1.03 — ABNORMAL HIGH (ref 1.005–1.030)
pH: 6.5 (ref 5.0–8.0)

## 2021-09-18 LAB — URINALYSIS, MICROSCOPIC (REFLEX): Squamous Epithelial / HPF: >50 (ref 0–5)

## 2021-09-18 LAB — GLUCOSE, CAPILLARY: Glucose-Capillary: 76 mg/dL (ref 70–99)

## 2021-09-18 MED ORDER — CEFADROXIL 500 MG PO CAPS: 500.0000 mg | ORAL_CAPSULE | Freq: Two times a day (BID) | ORAL | 0 refills | Status: AC

## 2021-09-18 MED ORDER — ONDANSETRON 8 MG PO TBDP: 8.0000 mg | ORAL_TABLET | Freq: Three times a day (TID) | ORAL | 0 refills | Status: DC | PRN

## 2021-09-18 MED ORDER — ONDANSETRON 4 MG PO TBDP
8.0000 mg | ORAL_TABLET | Freq: Once | ORAL | Status: AC
  Administered 2021-09-18: 8 mg via ORAL
  Filled 2021-09-18: qty 2

## 2021-09-18 NOTE — MAU Note (Signed)
Didn't feel good when she woke up, felt like she was going to get sick.  Eventually she did throw up once, still nauseated.  Feel weak, has a HA, feels hot and cold, short of breath. Denies cough, fever or sore throat.

## 2021-09-18 NOTE — MAU Provider Note (Signed)
History     CSN: [id number]  Arrival date and time: 09/18/21 1627   Event Date/Time   First Provider Initiated Contact with Patient 09/18/21 1703      Chief Complaint  Patient presents with   Headache   Nausea   Shortness of Breath   Fatigue   HPI Diana Farrell is a 26 y.o. [id number] at 5560w1d who presents stating this am she woke up and felt nauseous. She vomited one time after that feeling. She states she has continued to feel nauseous throughout the day. She reports she feels hot and cold. She has not eaten or drank anything today because she has not felt well. She reports she last ate popcorn shrimp last night. She denies any bleeding or leaking. She reports normal fetal movement. She is concerned she may have the flu. She reports intermittent burning with urination. She denies any abdominal pain.  She reported shortness of breath to the triage RN but upon CNM assessment, patient denies any shortness of breath. She is concerned about working this far along.   OB History     Gravida  4   Para  2   Term  2   Preterm      AB  1   Living  2      SAB      IAB  1   Ectopic      Multiple  0   Live Births  2           Past Medical History:  Diagnosis Date   Anemia, antepartum(648.23)    Other abscess of vulva    SVD (spontaneous vaginal delivery) 08/05/2013    Past Surgical History:  Procedure Laterality Date   APPENDECTOMY     LAPAROSCOPIC APPENDECTOMY N/A 02/14/2019   Procedure: APPENDECTOMY LAPAROSCOPIC;  Surgeon: Violeta Gelinashompson, Burke, MD;  Location: [facility] OR;  Service: General;  Laterality: N/A;   WISDOM TOOTH EXTRACTION      Family History  Adopted: Yes  Family history unknown: Yes    Social History   Tobacco Use   Smoking status: Never   Smokeless tobacco: Never  Vaping Use   Vaping Use: Never used  Substance Use Topics   Alcohol use: No   Drug use: No    Allergies: No Known Allergies  Medications Prior to Admission  Medication Sig  Dispense Refill Last Dose   amphetamine-dextroamphetamine (ADDERALL) 20 MG tablet Take 20 mg by mouth daily.   Past Week   acetaminophen (TYLENOL) 325 MG tablet Take 650 mg by mouth every 6 (six) hours as needed.      FLUoxetine (PROZAC) 20 MG tablet Take 20 mg by mouth daily.      Prenatal Vit-Fe Fumarate-FA (PRENATAL MULTIVITAMIN) TABS tablet Take 1 tablet by mouth daily at 12 noon.       Review of Systems  Constitutional:  Positive for chills and diaphoresis. Negative for fatigue and fever.  HENT: Negative.    Respiratory: Negative.  Negative for shortness of breath.   Cardiovascular: Negative.  Negative for chest pain.  Gastrointestinal:  Positive for nausea and vomiting. Negative for abdominal pain, constipation and diarrhea.  Genitourinary:  Positive for dysuria. Negative for vaginal bleeding and vaginal discharge.  Neurological: Negative.  Negative for dizziness and headaches.  Physical Exam   Blood pressure 114/69, pulse 99, temperature 98.9 F (37.2 C), temperature source Oral, resp. rate 17, height 4\' 11"  (1.499 m), weight 67.9 kg, SpO2 98 %, unknown if currently breastfeeding.  Physical  Exam Vitals and nursing note reviewed.  Constitutional:      General: She is not in acute distress.    Appearance: She is well-developed.  HENT:     Head: Normocephalic.  Eyes:     Pupils: Pupils are equal, round, and reactive to light.  Cardiovascular:     Rate and Rhythm: Normal rate and regular rhythm.     Heart sounds: Normal heart sounds.  Pulmonary:     Effort: Pulmonary effort is normal. No respiratory distress.     Breath sounds: Normal breath sounds.  Abdominal:     General: Bowel sounds are normal. There is no distension.     Palpations: Abdomen is soft.     Tenderness: There is no abdominal tenderness. There is no right CVA tenderness or left CVA tenderness.  Skin:    General: Skin is warm and dry.  Neurological:     Mental Status: She is alert and oriented to person,  place, and time.  Psychiatric:        Mood and Affect: Mood normal.        Behavior: Behavior normal.        Thought Content: Thought content normal.        Judgment: Judgment normal.   Fetal Tracing:  Baseline: 120 Variability: moderate Accels: 15x15 Decels: none  Toco: none   MAU Course  Procedures Results for orders placed or performed during the hospital encounter of 09/18/21 (from the past 24 hour(s))  Urinalysis, Routine w reflex microscopic Urine, Clean Catch     Status: Abnormal   Collection Time: 09/18/21  5:14 PM  Result Value Ref Range   Color, Urine AMBER (A) YELLOW   APPearance HAZY (A) CLEAR   Specific Gravity, Urine >1.030 (H) 1.005 - 1.030   pH 6.5 5.0 - 8.0   Glucose, UA NEGATIVE NEGATIVE mg/dL   Hgb urine dipstick NEGATIVE NEGATIVE   Bilirubin Urine SMALL (A) NEGATIVE   Ketones, ur >80 (A) NEGATIVE mg/dL   Protein, ur 30 (A) NEGATIVE mg/dL   Nitrite POSITIVE (A) NEGATIVE   Leukocytes,Ua TRACE (A) NEGATIVE  Resp Panel by RT-PCR (Flu A&B, Covid) Nasopharyngeal Swab     Status: None   Collection Time: 09/18/21  5:14 PM   Specimen: Nasopharyngeal Swab; Nasopharyngeal(NP) swabs in vial transport medium  Result Value Ref Range   SARS Coronavirus 2 by RT PCR NEGATIVE NEGATIVE   Influenza A by PCR NEGATIVE NEGATIVE   Influenza B by PCR NEGATIVE NEGATIVE  Urinalysis, Microscopic (reflex)     Status: Abnormal   Collection Time: 09/18/21  5:14 PM  Result Value Ref Range   RBC / HPF 0-5 0 - 5 RBC/hpf   WBC, UA 6-10 0 - 5 WBC/hpf   Bacteria, UA RARE (A) NONE SEEN   Squamous Epithelial / LPF >50 0 - 5   Mucus PRESENT   Glucose, capillary     Status: None   Collection Time: 09/18/21  5:20 PM  Result Value Ref Range   Glucose-Capillary 76 70 - 99 mg/dL  CBC with Differential/Platelet     Status: Abnormal   Collection Time: 09/18/21  6:41 PM  Result Value Ref Range   WBC 6.7 4.0 - 10.5 K/uL   RBC 3.80 (L) 3.87 - 5.11 MIL/uL   Hemoglobin 10.1 (L) 12.0 - 15.0  g/dL   HCT 93.8 (L) 10.1 - 75.1 %   MCV 79.7 (L) 80.0 - 100.0 fL   MCH 26.6 26.0 - 34.0 pg  MCHC 33.3 30.0 - 36.0 g/dL   RDW 22.4 82.5 - 00.3 %   Platelets 287 150 - 400 K/uL   nRBC 0.0 0.0 - 0.2 %   Neutrophils Relative % 81 %   Neutro Abs 5.5 1.7 - 7.7 K/uL   Lymphocytes Relative 13 %   Lymphs Abs 0.9 0.7 - 4.0 K/uL   Monocytes Relative 5 %   Monocytes Absolute 0.3 0.1 - 1.0 K/uL   Eosinophils Relative 0 %   Eosinophils Absolute 0.0 0.0 - 0.5 K/uL   Basophils Relative 0 %   Basophils Absolute 0.0 0.0 - 0.1 K/uL   Immature Granulocytes 1 %   Abs Immature Granulocytes 0.04 0.00 - 0.07 K/uL    MDM UA, UC CBC with Diff Resp Panel CBG Zofran ODT- patient reports resolution of nausea, no episodes of vomiting while in MAU  Assessment and Plan   1. Urinary tract infection in mother during third trimester of pregnancy   2. [redacted] weeks gestation of pregnancy    -Discharge home in stable condition -Rx for duracef and zofran sent to patient's pharmacy -Pyelonephritis precautions discussed -Patient advised to follow-up with OB as scheduled for prenatal care -Patient may return to MAU as needed or if her condition were to change or worsen   Rolm Bookbinder CNM 09/18/2021, 5:03 PM

## 2021-09-18 NOTE — Discharge Instructions (Signed)

## 2021-09-20 LAB — CULTURE, OB URINE: Culture: >=100000 — AB

## 2021-10-05 DIAGNOSIS — Z419 Encounter for procedure for purposes other than remedying health state, unspecified: Secondary | ICD-10-CM | POA: Diagnosis not present

## 2021-10-12 DIAGNOSIS — F900 Attention-deficit hyperactivity disorder, predominantly inattentive type: Secondary | ICD-10-CM | POA: Diagnosis not present

## 2021-10-12 DIAGNOSIS — F819 Developmental disorder of scholastic skills, unspecified: Secondary | ICD-10-CM | POA: Diagnosis not present

## 2021-10-20 ENCOUNTER — Inpatient Hospital Stay (HOSPITAL_COMMUNITY)
Admission: AD | Admit: 2021-10-20 | Discharge: 2021-10-20 | Disposition: A | Discharge status: Home / Self Care | Payer: Medicaid Other | Attending: Obstetrics and Gynecology | Admitting: Obstetrics and Gynecology

## 2021-10-20 ENCOUNTER — Encounter (HOSPITAL_COMMUNITY): Payer: Self-pay | Admitting: Obstetrics and Gynecology

## 2021-10-20 DIAGNOSIS — R109 Unspecified abdominal pain: Secondary | ICD-10-CM | POA: Insufficient documentation

## 2021-10-20 DIAGNOSIS — R1083 Colic: Secondary | ICD-10-CM

## 2021-10-20 DIAGNOSIS — O26893 Other specified pregnancy related conditions, third trimester: Secondary | ICD-10-CM | POA: Diagnosis not present

## 2021-10-20 DIAGNOSIS — Z3A36 36 weeks gestation of pregnancy: Secondary | ICD-10-CM | POA: Insufficient documentation

## 2021-10-20 LAB — COMPREHENSIVE METABOLIC PANEL
ALT: 11 U/L (ref 0–44)
AST: 15 U/L (ref 15–41)
Albumin: 2.3 g/dL — ABNORMAL LOW (ref 3.5–5.0)
Alkaline Phosphatase: 185 U/L — ABNORMAL HIGH (ref 38–126)
Anion gap: 8 (ref 5–15)
BUN: 5 mg/dL — ABNORMAL LOW (ref 6–20)
CO2: 23 mmol/L (ref 22–32)
Calcium: 8.6 mg/dL — ABNORMAL LOW (ref 8.9–10.3)
Chloride: 108 mmol/L (ref 98–111)
Creatinine, Ser: 0.5 mg/dL (ref 0.44–1.00)
GFR, Estimated: >60 mL/min (ref >60)
Glucose, Bld: 85 mg/dL (ref 70–99)
Potassium: 3.8 mmol/L (ref 3.5–5.1)
Sodium: 139 mmol/L (ref 135–145)
Total Bilirubin: 0.3 mg/dL (ref 0.3–1.2)
Total Protein: 5.6 g/dL — ABNORMAL LOW (ref 6.5–8.1)

## 2021-10-20 LAB — OB RESULTS CONSOLE GBS: GBS: NEGATIVE

## 2021-10-20 LAB — CBC
HCT: 30.9 % — ABNORMAL LOW (ref 36.0–46.0)
Hemoglobin: 10 g/dL — ABNORMAL LOW (ref 12.0–15.0)
MCH: 25.8 pg — ABNORMAL LOW (ref 26.0–34.0)
MCHC: 32.4 g/dL (ref 30.0–36.0)
MCV: 79.6 fL — ABNORMAL LOW (ref 80.0–100.0)
Platelets: 314 10*3/uL (ref 150–400)
RBC: 3.88 MIL/uL (ref 3.87–5.11)
RDW: 15.5 % (ref 11.5–15.5)
WBC: 11.6 10*3/uL — ABNORMAL HIGH (ref 4.0–10.5)
nRBC: 0 % (ref 0.0–0.2)

## 2021-10-20 LAB — LIPASE, BLOOD: Lipase: 32 U/L (ref 11–51)

## 2021-10-20 LAB — AMYLASE: Amylase: 50 U/L (ref 28–100)

## 2021-10-20 MED ORDER — SIMETHICONE 80 MG PO CHEW
160.0000 mg | CHEWABLE_TABLET | Freq: Once | ORAL | Status: AC
  Administered 2021-10-20: 160 mg via ORAL
  Filled 2021-10-20: qty 2

## 2021-10-20 MED ORDER — HYOSCYAMINE SULFATE 0.125 MG SL SUBL
0.1250 mg | SUBLINGUAL_TABLET | Freq: Once | SUBLINGUAL | Status: AC
  Administered 2021-10-20: 0.125 mg via SUBLINGUAL
  Filled 2021-10-20: qty 1

## 2021-10-20 MED ORDER — SIMETHICONE 80 MG PO CHEW
80.0000 mg | CHEWABLE_TABLET | Freq: Four times a day (QID) | ORAL | 0 refills | Status: DC | PRN
Start: 2021-10-20 — End: 2021-11-01

## 2021-10-20 NOTE — MAU Note (Signed)
..  Diana Farrell is a 26 y.o. at [redacted]w[redacted]d here in MAU reporting: Right sided ABD pain that is an bad stomachache since 2300 that was newly onset, constant and sudden. Pt ate cereal @ 2030 Pt denies dysuria, oligoria DFM, VB, LOF, abnormal discharge.  Pt denies ctx and cramping.  Onset of complaint: 2330 Pain score: 8/10 Vitals:   10/20/21 0246 10/20/21 0249  BP:  132/78  Pulse:  (!) 49  Resp:  18  Temp:  (!) 97.5 F (36.4 C)  SpO2: 98% 96%     FHT:150 Lab orders placed from triage:  UA

## 2021-10-20 NOTE — MAU Provider Note (Signed)
Chief Complaint:  Abdominal Pain   Event Date/Time   First Provider Initiated Contact with Patient 10/20/21 0315     HPI: Diana Farrell is a 26 y.o. [id number] at 69w5dwho presents to maternity admissions reporting sudden onset of crampy pain on right lateral abdomen.  Doesn't move. Hurts more with palpation. Had appendectomy three years ago. . She reports good fetal movement, denies LOF, vaginal bleeding, vaginal itching/burning, urinary symptoms, h/a, dizziness, n/v, diarrhea, constipation or fever/chills.  .  Abdominal Pain This is a new problem. The current episode started today. The onset quality is sudden. The problem occurs constantly. The problem has been unchanged. Pain location: right lateral abdomen. The quality of the pain is cramping and colicky. The abdominal pain does not radiate. Associated symptoms include constipation. Pertinent negatives include no anorexia, diarrhea, dysuria, fever or frequency. The pain is aggravated by palpation. The pain is relieved by Nothing. She has tried nothing for the symptoms.   RN note: Diana KitchenMarland KitchenPanzie Farrell is a 26 y.o. at [redacted]w[redacted]d here in MAU reporting: Right sided ABD pain that is an bad stomachache since 2300 that was newly onset, constant and sudden. Pt ate cereal @ 2030 Pt denies dysuria, oligoria DFM, VB, LOF, abnormal discharge.  Pt denies ctx and cramping.  Onset of complaint: 2330 Pain score: 8/10  Past Medical History: Past Medical History:  Diagnosis Date   Anemia, antepartum(648.23)    Other abscess of vulva    SVD (spontaneous vaginal delivery) 08/05/2013    Past obstetric history: OB History  Gravida Para Term Preterm AB Living  4 2 2   1 2   SAB IAB Ectopic Multiple Live Births    1   0 2    # Outcome Date GA Lbr Len/2nd Weight Sex Delivery Anes PTL Lv  4 Current           3 Term 09/23/20 [redacted]w[redacted]d 13:46 / 00:22 2870 g F Vag-Spont EPI  LIV  2 Term 08/04/13 [redacted]w[redacted]d 19:33 / 02:20 3456 g M Vag-Spont EPI, Local  LIV  1 IAB              Past Surgical History: Past Surgical History:  Procedure Laterality Date   APPENDECTOMY     LAPAROSCOPIC APPENDECTOMY N/A 02/14/2019   Procedure: APPENDECTOMY LAPAROSCOPIC;  Surgeon: Georganna Skeans, MD;  Location: [place];  Service: General;  Laterality: N/A;   WISDOM TOOTH EXTRACTION      Family History: Family History  Adopted: Yes  Family history unknown: Yes    Social History: Social History   Tobacco Use   Smoking status: Never   Smokeless tobacco: Never  Vaping Use   Vaping Use: Never used  Substance Use Topics   Alcohol use: No   Drug use: No    Allergies: No Known Allergies  Meds:  Medications Prior to Admission  Medication Sig Dispense Refill Last Dose   acetaminophen (TYLENOL) 325 MG tablet Take 650 mg by mouth every 6 (six) hours as needed.      amphetamine-dextroamphetamine (ADDERALL) 20 MG tablet Take 20 mg by mouth daily.      FLUoxetine (PROZAC) 20 MG tablet Take 20 mg by mouth daily.      ondansetron (ZOFRAN-ODT) 8 MG disintegrating tablet Take 1 tablet (8 mg total) by mouth every 8 (eight) hours as needed for nausea or vomiting. 20 tablet 0    Prenatal Vit-Fe Fumarate-FA (PRENATAL MULTIVITAMIN) TABS tablet Take 1 tablet by mouth daily at 12 noon.  I have reviewed patient's Past Medical Hx, Surgical Hx, Family Hx, Social Hx, medications and allergies.   ROS:  Review of Systems  Constitutional:  Negative for fever.  Gastrointestinal:  Positive for abdominal pain and constipation. Negative for anorexia and diarrhea.  Genitourinary:  Negative for dysuria and frequency.  Other systems negative  Physical Exam  Patient Vitals for the past 24 hrs:  BP Temp Temp src Pulse Resp SpO2 Height Weight  10/20/21 0249 132/78 (!) 97.5 F (36.4 C) Oral (!) 49 18 96 % 4\' 11"  (1.499 m) 68.1 kg  10/20/21 0246 -- -- -- -- -- 98 % -- --   Constitutional: Well-developed, well-nourished female in no acute distress.  Cardiovascular: normal rate and  rhythm Respiratory: normal effort, clear to auscultation bilaterally GI: Abd soft, tender over right lateral abdomen,  Negative Murphy sign. No RUQ tenderness. Abdomen is gravid appropriate for gestational age.   No rebound or guarding. MS: Extremities nontender, no edema, normal ROM Neurologic: Alert and oriented x 4.  GU: Neg CVAT.  PELVIC EXAM:  deferred due to no contractions  FHT:  Baseline 140 , moderate variability, accelerations present, no decelerations Contractions: Rare   Labs: Results for orders placed or performed during the hospital encounter of 10/20/21 (from the past 24 hour(s))  CBC     Status: Abnormal   Collection Time: 10/20/21  3:36 AM  Result Value Ref Range   WBC 11.6 (H) 4.0 - 10.5 K/uL   RBC 3.88 3.87 - 5.11 MIL/uL   Hemoglobin 10.0 (L) 12.0 - 15.0 g/dL   HCT 30.9 (L) 36.0 - 46.0 %   MCV 79.6 (L) 80.0 - 100.0 fL   MCH 25.8 (L) 26.0 - 34.0 pg   MCHC 32.4 30.0 - 36.0 g/dL   RDW 15.5 11.5 - 15.5 %   Platelets 314 150 - 400 K/uL   nRBC 0.0 0.0 - 0.2 %  Comprehensive metabolic panel     Status: Abnormal   Collection Time: 10/20/21  3:36 AM  Result Value Ref Range   Sodium 139 135 - 145 mmol/L   Potassium 3.8 3.5 - 5.1 mmol/L   Chloride 108 98 - 111 mmol/L   CO2 23 22 - 32 mmol/L   Glucose, Bld 85 70 - 99 mg/dL   BUN 5 (L) 6 - 20 mg/dL   Creatinine, Ser 0.50 0.44 - 1.00 mg/dL   Calcium 8.6 (L) 8.9 - 10.3 mg/dL   Total Protein 5.6 (L) 6.5 - 8.1 g/dL   Albumin 2.3 (L) 3.5 - 5.0 g/dL   AST 15 15 - 41 U/L   ALT 11 0 - 44 U/L   Alkaline Phosphatase 185 (H) 38 - 126 U/L   Total Bilirubin 0.3 0.3 - 1.2 mg/dL   GFR, Estimated >60 >60 mL/min   Anion gap 8 5 - 15  Lipase, blood     Status: None   Collection Time: 10/20/21  3:36 AM  Result Value Ref Range   Lipase 32 11 - 51 U/L  Amylase     Status: None   Collection Time: 10/20/21  3:36 AM  Result Value Ref Range   Amylase 50 28 - 100 U/L     Imaging:  Pt informed that the ultrasound is considered a  limited OB ultrasound and is not intended to be a complete ultrasound exam.  Patient also informed that the ultrasound is not being completed with the intent of assessing for fetal or placental anomalies or any pelvic abnormalities.  Explained that the purpose of todays ultrasound is to assess for presentation  Patient acknowledges the purpose of the exam and the limitations of the study.    Fetus is vertex  MAU Course/MDM: I have ordered labs and reviewed results. These are normal  No significant leukocytosis.  No elevation in transaminases. NST reviewed, reactive, no contractions Consult Dr Marvel Plan (who was monitoring chart) with presentation, exam findings and test results    Treatments in MAU included Levsin and Simethicone, which brought pain down to a "4".  Patient sleeping afterward. .    Assessment: Single IUP at.[redacted]w[redacted]d Right lateral abdominal pain, likely due to gas Reassuring fetal heart rate pattern  Plan: Discharge home Labor precautions and fetal kick counts Follow up in Office for prenatal visits  GBS done per request Dr Marvel Plan Encouraged to return if she develops worsening of symptoms, increase in pain, fever, or other concerning symptoms.   Pt stable at time of discharge.  Hansel Feinstein CNM, MSN Certified Nurse-Midwife 10/20/2021 3:15 AM

## 2021-10-22 LAB — CULTURE, BETA STREP (GROUP B ONLY)

## 2021-10-30 ENCOUNTER — Other Ambulatory Visit: Payer: Self-pay

## 2021-10-30 ENCOUNTER — Inpatient Hospital Stay (HOSPITAL_COMMUNITY): Payer: Medicaid Other | Admitting: Anesthesiology

## 2021-10-30 ENCOUNTER — Inpatient Hospital Stay (HOSPITAL_COMMUNITY)
Admission: AD | Admit: 2021-10-30 | Discharge: 2021-11-01 | DRG: 798 | Disposition: A | Discharge status: Home / Self Care | Payer: Medicaid Other | Attending: Obstetrics and Gynecology | Admitting: Obstetrics and Gynecology

## 2021-10-30 ENCOUNTER — Encounter (HOSPITAL_COMMUNITY): Payer: Self-pay | Admitting: Obstetrics and Gynecology

## 2021-10-30 DIAGNOSIS — Z3A38 38 weeks gestation of pregnancy: Secondary | ICD-10-CM

## 2021-10-30 DIAGNOSIS — Z302 Encounter for sterilization: Secondary | ICD-10-CM | POA: Diagnosis not present

## 2021-10-30 DIAGNOSIS — O4292 Full-term premature rupture of membranes, unspecified as to length of time between rupture and onset of labor: Principal | ICD-10-CM | POA: Diagnosis present

## 2021-10-30 DIAGNOSIS — O99344 Other mental disorders complicating childbirth: Secondary | ICD-10-CM | POA: Diagnosis present

## 2021-10-30 DIAGNOSIS — F909 Attention-deficit hyperactivity disorder, unspecified type: Secondary | ICD-10-CM | POA: Diagnosis not present

## 2021-10-30 DIAGNOSIS — O26893 Other specified pregnancy related conditions, third trimester: Secondary | ICD-10-CM | POA: Diagnosis not present

## 2021-10-30 LAB — TYPE AND SCREEN
ABO/RH(D): O POS
Antibody Screen: NEGATIVE

## 2021-10-30 LAB — RAPID HIV SCREEN (HIV 1/2 AB+AG)
HIV 1/2 Antibodies: NONREACTIVE
HIV-1 P24 Antigen - HIV24: NONREACTIVE

## 2021-10-30 LAB — CBC
HCT: 32.4 % — ABNORMAL LOW (ref 36.0–46.0)
Hemoglobin: 10 g/dL — ABNORMAL LOW (ref 12.0–15.0)
MCH: 24.9 pg — ABNORMAL LOW (ref 26.0–34.0)
MCHC: 30.9 g/dL (ref 30.0–36.0)
MCV: 80.6 fL (ref 80.0–100.0)
Platelets: 348 10*3/uL (ref 150–400)
RBC: 4.02 MIL/uL (ref 3.87–5.11)
RDW: 15.8 % — ABNORMAL HIGH (ref 11.5–15.5)
WBC: 11.6 10*3/uL — ABNORMAL HIGH (ref 4.0–10.5)
nRBC: 0 % (ref 0.0–0.2)

## 2021-10-30 LAB — RPR: RPR Ser Ql: NONREACTIVE

## 2021-10-30 MED ORDER — DIPHENHYDRAMINE HCL 25 MG PO CAPS
25.0000 mg | ORAL_CAPSULE | Freq: Four times a day (QID) | ORAL | Status: DC | PRN
  Administered 2021-10-30: 25 mg via ORAL
  Filled 2021-10-30: qty 1

## 2021-10-30 MED ORDER — OXYCODONE-ACETAMINOPHEN 5-325 MG PO TABS: 1.0000 | ORAL_TABLET | ORAL | Status: DC | PRN

## 2021-10-30 MED ORDER — LACTATED RINGERS IV SOLN: INTRAVENOUS | Status: DC

## 2021-10-30 MED ORDER — FLEET ENEMA 7-19 GM/118ML RE ENEM: 1.0000 | ENEMA | Freq: Every day | RECTAL | Status: DC | PRN

## 2021-10-30 MED ORDER — LACTATED RINGERS IV SOLN: 500.0000 mL | Freq: Once | INTRAVENOUS | Status: DC

## 2021-10-30 MED ORDER — LIDOCAINE HCL (PF) 1 % IJ SOLN
INTRAMUSCULAR | Status: DC | PRN
  Administered 2021-10-30 (×2): 4 mL via EPIDURAL

## 2021-10-30 MED ORDER — SIMETHICONE 80 MG PO CHEW: 80.0000 mg | CHEWABLE_TABLET | ORAL | Status: DC | PRN

## 2021-10-30 MED ORDER — TRANEXAMIC ACID-NACL 1000-0.7 MG/100ML-% IV SOLN: 1000.0000 mg | INTRAVENOUS | Status: AC

## 2021-10-30 MED ORDER — LIDOCAINE HCL (PF) 1 % IJ SOLN: 30.0000 mL | INTRAMUSCULAR | Status: DC | PRN

## 2021-10-30 MED ORDER — DIPHENHYDRAMINE HCL 50 MG/ML IJ SOLN: 12.5000 mg | INTRAMUSCULAR | Status: DC | PRN

## 2021-10-30 MED ORDER — OXYCODONE HCL 5 MG PO TABS
5.0000 mg | ORAL_TABLET | ORAL | Status: DC | PRN
  Administered 2021-10-30 – 2021-10-31 (×2): 5 mg via ORAL
  Filled 2021-10-30 (×2): qty 1

## 2021-10-30 MED ORDER — ONDANSETRON HCL 4 MG/2ML IJ SOLN: 4.0000 mg | INTRAMUSCULAR | Status: DC | PRN

## 2021-10-30 MED ORDER — ESCITALOPRAM OXALATE 10 MG PO TABS
5.0000 mg | ORAL_TABLET | Freq: Every day | ORAL | Status: DC
  Administered 2021-10-30 – 2021-11-01 (×3): 5 mg via ORAL
  Filled 2021-10-30 (×3): qty 1

## 2021-10-30 MED ORDER — AMPHETAMINE-DEXTROAMPHETAMINE 10 MG PO TABS
10.0000 mg | ORAL_TABLET | ORAL | Status: DC
  Administered 2021-10-30 – 2021-11-01 (×3): 10 mg via ORAL
  Filled 2021-10-30 (×3): qty 1

## 2021-10-30 MED ORDER — EPHEDRINE 5 MG/ML INJ
10.0000 mg | INTRAVENOUS | Status: DC | PRN
Start: 2021-10-30 — End: 2021-11-01
  Filled 2021-10-30: qty 2

## 2021-10-30 MED ORDER — AMPHETAMINE-DEXTROAMPHET ER 10 MG PO CP24
20.0000 mg | ORAL_CAPSULE | Freq: Every day | ORAL | Status: DC
  Administered 2021-10-30 – 2021-11-01 (×3): 20 mg via ORAL
  Filled 2021-10-30 (×3): qty 2

## 2021-10-30 MED ORDER — BENZOCAINE-MENTHOL 20-0.5 % EX AERO
1.0000 "application " | INHALATION_SPRAY | CUTANEOUS | Status: DC | PRN
  Administered 2021-10-30: 1 via TOPICAL
  Filled 2021-10-30: qty 56

## 2021-10-30 MED ORDER — OXYCODONE HCL 5 MG PO TABS
10.0000 mg | ORAL_TABLET | ORAL | Status: DC | PRN
  Administered 2021-10-31 – 2021-11-01 (×4): 10 mg via ORAL
  Filled 2021-10-30 (×4): qty 2

## 2021-10-30 MED ORDER — TETANUS-DIPHTH-ACELL PERTUSSIS 5-2.5-18.5 LF-MCG/0.5 IM SUSY: 0.5000 mL | PREFILLED_SYRINGE | Freq: Once | INTRAMUSCULAR | Status: DC

## 2021-10-30 MED ORDER — METHYLERGONOVINE MALEATE 0.2 MG/ML IJ SOLN
INTRAMUSCULAR | Status: AC
Start: 2021-10-30 — End: 2021-10-30
  Filled 2021-10-30: qty 1

## 2021-10-30 MED ORDER — PHENYLEPHRINE 40 MCG/ML (10ML) SYRINGE FOR IV PUSH (FOR BLOOD PRESSURE SUPPORT)
80.0000 ug | PREFILLED_SYRINGE | INTRAVENOUS | Status: DC | PRN
  Filled 2021-10-30 (×2): qty 10

## 2021-10-30 MED ORDER — LACTATED RINGERS IV SOLN: 500.0000 mL | INTRAVENOUS | Status: DC | PRN

## 2021-10-30 MED ORDER — ONDANSETRON HCL 4 MG/2ML IJ SOLN: 4.0000 mg | Freq: Four times a day (QID) | INTRAMUSCULAR | Status: DC | PRN

## 2021-10-30 MED ORDER — PHENYLEPHRINE 40 MCG/ML (10ML) SYRINGE FOR IV PUSH (FOR BLOOD PRESSURE SUPPORT)
80.0000 ug | PREFILLED_SYRINGE | INTRAVENOUS | Status: DC | PRN
  Filled 2021-10-30: qty 10

## 2021-10-30 MED ORDER — DOCUSATE SODIUM 100 MG PO CAPS
100.0000 mg | ORAL_CAPSULE | Freq: Two times a day (BID) | ORAL | Status: DC
  Administered 2021-10-31 – 2021-11-01 (×2): 100 mg via ORAL
  Filled 2021-10-30 (×3): qty 1

## 2021-10-30 MED ORDER — OXYTOCIN-SODIUM CHLORIDE 30-0.9 UT/500ML-% IV SOLN: 2.5000 [IU]/h | INTRAVENOUS | Status: DC

## 2021-10-30 MED ORDER — ACETAMINOPHEN 325 MG PO TABS
650.0000 mg | ORAL_TABLET | ORAL | Status: DC | PRN
  Administered 2021-10-30 – 2021-10-31 (×4): 650 mg via ORAL
  Filled 2021-10-30 (×4): qty 2

## 2021-10-30 MED ORDER — TERBUTALINE SULFATE 1 MG/ML IJ SOLN: 0.2500 mg | Freq: Once | INTRAMUSCULAR | Status: DC | PRN

## 2021-10-30 MED ORDER — FAMOTIDINE 20 MG PO TABS
40.0000 mg | ORAL_TABLET | Freq: Once | ORAL | Status: AC
  Administered 2021-10-31: 40 mg via ORAL

## 2021-10-30 MED ORDER — PRENATAL MULTIVITAMIN CH
1.0000 | ORAL_TABLET | Freq: Every day | ORAL | Status: DC
  Administered 2021-10-30: 1 via ORAL
  Filled 2021-10-30: qty 1

## 2021-10-30 MED ORDER — IBUPROFEN 600 MG PO TABS
600.0000 mg | ORAL_TABLET | Freq: Four times a day (QID) | ORAL | Status: DC
  Administered 2021-10-30 – 2021-11-01 (×8): 600 mg via ORAL
  Filled 2021-10-30 (×9): qty 1

## 2021-10-30 MED ORDER — ACETAMINOPHEN 325 MG PO TABS: 650.0000 mg | ORAL_TABLET | ORAL | Status: DC | PRN

## 2021-10-30 MED ORDER — FENTANYL CITRATE (PF) 100 MCG/2ML IJ SOLN: 50.0000 ug | INTRAMUSCULAR | Status: DC | PRN

## 2021-10-30 MED ORDER — WITCH HAZEL-GLYCERIN EX PADS: 1.0000 "application " | MEDICATED_PAD | CUTANEOUS | Status: DC | PRN

## 2021-10-30 MED ORDER — COCONUT OIL OIL: 1.0000 "application " | TOPICAL_OIL | Status: DC | PRN

## 2021-10-30 MED ORDER — OXYTOCIN-SODIUM CHLORIDE 30-0.9 UT/500ML-% IV SOLN
1.0000 m[IU]/min | INTRAVENOUS | Status: DC
  Administered 2021-10-30: 2 m[IU]/min via INTRAVENOUS
  Filled 2021-10-30: qty 500

## 2021-10-30 MED ORDER — SOD CITRATE-CITRIC ACID 500-334 MG/5ML PO SOLN: 30.0000 mL | ORAL | Status: DC | PRN

## 2021-10-30 MED ORDER — FENTANYL-BUPIVACAINE-NACL 0.5-0.125-0.9 MG/250ML-% EP SOLN
12.0000 mL/h | EPIDURAL | Status: DC | PRN
  Administered 2021-10-30: 12 mL/h via EPIDURAL
  Filled 2021-10-30: qty 250

## 2021-10-30 MED ORDER — DIBUCAINE (PERIANAL) 1 % EX OINT
1.0000 | TOPICAL_OINTMENT | CUTANEOUS | Status: DC | PRN
Start: 2021-10-30 — End: 2021-11-01

## 2021-10-30 MED ORDER — EPHEDRINE 5 MG/ML INJ
10.0000 mg | INTRAVENOUS | Status: DC | PRN
  Filled 2021-10-30: qty 2

## 2021-10-30 MED ORDER — METHYLERGONOVINE MALEATE 0.2 MG/ML IJ SOLN: 0.2000 mg | Freq: Once | INTRAMUSCULAR | Status: AC

## 2021-10-30 MED ORDER — METOCLOPRAMIDE HCL 10 MG PO TABS
10.0000 mg | ORAL_TABLET | Freq: Once | ORAL | Status: AC
  Administered 2021-10-31: 10 mg via ORAL

## 2021-10-30 MED ORDER — TRANEXAMIC ACID-NACL 1000-0.7 MG/100ML-% IV SOLN
INTRAVENOUS | Status: AC
Start: 2021-10-30 — End: 2021-10-30
  Administered 2021-10-30: 1000 mg
  Filled 2021-10-30: qty 100

## 2021-10-30 MED ORDER — BISACODYL 10 MG RE SUPP: 10.0000 mg | Freq: Every day | RECTAL | Status: DC | PRN

## 2021-10-30 MED ORDER — OXYCODONE-ACETAMINOPHEN 5-325 MG PO TABS: 2.0000 | ORAL_TABLET | ORAL | Status: DC | PRN

## 2021-10-30 MED ORDER — AMPHETAMINE-DEXTROAMPHETAMINE 10 MG PO TABS: 20.0000 mg | ORAL_TABLET | Freq: Every day | ORAL | Status: DC

## 2021-10-30 MED ORDER — ONDANSETRON HCL 4 MG PO TABS: 4.0000 mg | ORAL_TABLET | ORAL | Status: DC | PRN

## 2021-10-30 MED ORDER — OXYTOCIN BOLUS FROM INFUSION
333.0000 mL | Freq: Once | INTRAVENOUS | Status: AC
  Administered 2021-10-30: 333 mL via INTRAVENOUS

## 2021-10-30 NOTE — Anesthesia Procedure Notes (Signed)
Epidural Patient location during procedure: OB Start time: 10/30/2021 3:24 AM End time: 10/30/2021 3:27 AM  Staffing Anesthesiologist: Kaylyn Layer, MD Performed: anesthesiologist   Preanesthetic Checklist Completed: patient identified, IV checked, risks and benefits discussed, monitors and equipment checked, pre-op evaluation and timeout performed  Epidural Patient position: sitting Prep: DuraPrep and site prepped and draped Patient monitoring: continuous pulse ox, blood pressure and heart rate Approach: midline Location: L3-L4 Injection technique: LOR air  Needle:  Needle type: Tuohy  Needle gauge: 17 G Needle length: 9 cm Needle insertion depth: 6 cm Catheter type: closed end flexible Catheter size: 19 Gauge Catheter at skin depth: 11 cm Test dose: negative and Other (1% lidocaine)  Assessment Events: blood not aspirated, injection not painful, no injection resistance, no paresthesia and negative IV test  Additional Notes Patient identified. Risks, benefits, and alternatives discussed with patient including but not limited to bleeding, infection, nerve damage, paralysis, failed block, incomplete pain control, headache, blood pressure changes, nausea, vomiting, reactions to medication, itching, and postpartum back pain. Confirmed with bedside nurse the patient's most recent platelet count. Confirmed with patient that they are not currently taking any anticoagulation, have any bleeding history, or any family history of bleeding disorders. Patient expressed understanding and wished to proceed. All questions were answered. Sterile technique was used throughout the entire procedure. Please see nursing notes for vital signs.   Crisp LOR on first pass. Test dose was given through epidural catheter and negative prior to continuing to dose epidural or start infusion. Warning signs of high block given to the patient including shortness of breath, tingling/numbness in hands, complete  motor block, or any concerning symptoms with instructions to call for help. Patient was given instructions on fall risk and not to get out of bed. All questions and concerns addressed with instructions to call with any issues or inadequate analgesia.  Reason for block:procedure for pain

## 2021-10-30 NOTE — H&P (Signed)
Diana Farrell is a 26 y.o. female [phone] [redacted]w[redacted]d presenting for leakage of fluid and spotting since 2100. She reports painful contractions, every 5-10 minutes. Normal fetal movement.   ROM confirmed in MAU.   Pregnancy c/b: ADHD: on Adderall XR 20mg  Anxiety/depression: no meds, ordered Lexapro at 27 weeks but did not start Last seen in office 09/22/21  OB History     Gravida  4   Para  2   Term  2   Preterm      AB  1   Living  2      SAB      IAB  1   Ectopic      Multiple  0   Live Births  2          Past Medical History:  Diagnosis Date   Anemia, antepartum(648.23)    Other abscess of vulva    SVD (spontaneous vaginal delivery) 08/05/2013   Past Surgical History:  Procedure Laterality Date   APPENDECTOMY     LAPAROSCOPIC APPENDECTOMY N/A 02/14/2019   Procedure: APPENDECTOMY LAPAROSCOPIC;  Surgeon: 04/16/2019, MD;  Location: [facility] OR;  Service: General;  Laterality: N/A;   WISDOM TOOTH EXTRACTION     Family History: She was adopted. Family history is unknown by patient. Social History:  reports that she has never smoked. She has never used smokeless tobacco. She reports that she does not drink alcohol and does not use drugs.     Maternal Diabetes: No Genetic Screening: Normal Maternal Ultrasounds/Referrals: Normal Fetal Ultrasounds or other Referrals:  None Maternal Substance Abuse:  No Significant Maternal Medications:  Adderall Significant Maternal Lab Results:  Group B Strep negative Other Comments:  None  Review of Systems Per HPI Exam Physical Exam  Dilation: 3 Exam by:: Kooistra CNM Blood pressure 122/76, pulse 77, temperature 98.1 F (36.7 C), temperature source Oral, resp. rate 18, SpO2 99 %, unknown if currently breastfeeding. Gen: NAD, appears uncomfortable with contractions CVS: Normal pulses Lungs:nonlabored respirations Abd: Gravid abdomen, [place] 7lbs Ext: no calf edema or tenderness  Fetal testing: 135bpm, mod  variability, + accels, no decels Toco: ctx q 2-6 mins  Prenatal labs: ABO, Rh: O pos   Antibody:  neg Rubella: Immune (08/16 0000) RPR: Nonreactive (08/16 0000)  HBsAg: Negative (08/16 0000)  HIV: NON REACTIVE (02/26 0145)  GBS: Negative/-- (02/16 0000)   Assessment/Plan: 25Y 07-04-1989 @ [redacted]w[redacted]d, ROM, early labor Fetal wellbeing: cat I tracing ROM: contractions present but irregular, will start pitocin for labor augmentation  Pain control: epidural upon patient request Desires permanent sterilization. Confirmed signed consent in [facility] chart from 08/15/21. Discussed postpartum tubal ligation will depend on OR and surgeon availability. If unable to perform, can plan interval sterilization. If cesarean indicated for any reason, would plan sterilization at that time. Patient states understanding.   14/12/22 10/30/2021, 2:44 AM

## 2021-10-30 NOTE — MAU Note (Signed)
Pt presents to MAU with c/o light pink spotting since 2100.  Pt also complains of lower pelvic pain and a vaginal lump that hurts when she presses on it.  Pt denies LOF. +FM. Last sexual intercourse -  3 weeks ago.  FHR - 125. Pain: 6-7/10.

## 2021-10-30 NOTE — MAU Provider Note (Signed)
Patient Diana Farrell is a 26 y.o. [phone]  At [redacted]w[redacted]d here with complaints of spotting, possible LOF at 9pm (about 4 hours ago). She denies any complications in this pregnancy; she denies any history of HTN, DM. She also reports a "cyst" on her labia.  She denies any c/section in the past.  History     CSN: [id number]  Arrival date and time: 10/30/21 0015   None     Chief Complaint  Patient presents with   Vaginal Bleeding   Vaginal Bleeding The patient's primary symptoms include vaginal bleeding. This is a new problem. Pertinent negatives include no diarrhea, fever, rash, urgency or vomiting. The vaginal discharge was bloody. She has not been passing clots. She has not been passing tissue.  She reports some spotting on her underwear and feeling lots of fluid when she wipes.   OB History     Gravida  4   Para  2   Term  2   Preterm      AB  1   Living  2      SAB      IAB  1   Ectopic      Multiple  0   Live Births  2           Past Medical History:  Diagnosis Date   Anemia, antepartum(648.23)    Other abscess of vulva    SVD (spontaneous vaginal delivery) 08/05/2013    Past Surgical History:  Procedure Laterality Date   APPENDECTOMY     LAPAROSCOPIC APPENDECTOMY N/A 02/14/2019   Procedure: APPENDECTOMY LAPAROSCOPIC;  Surgeon: Violeta Gelinas, MD;  Location: [facility] OR;  Service: General;  Laterality: N/A;   WISDOM TOOTH EXTRACTION      Family History  Adopted: Yes  Family history unknown: Yes    Social History   Tobacco Use   Smoking status: Never   Smokeless tobacco: Never  Vaping Use   Vaping Use: Never used  Substance Use Topics   Alcohol use: No   Drug use: No    Allergies: No Known Allergies  Medications Prior to Admission  Medication Sig Dispense Refill Last Dose   amphetamine-dextroamphetamine (ADDERALL) 20 MG tablet Take 20 mg by mouth daily.   10/29/2021   acetaminophen (TYLENOL) 325 MG tablet Take 650 mg by mouth every 6 (six)  hours as needed.      FLUoxetine (PROZAC) 20 MG tablet Take 20 mg by mouth daily.      ondansetron (ZOFRAN-ODT) 8 MG disintegrating tablet Take 1 tablet (8 mg total) by mouth every 8 (eight) hours as needed for nausea or vomiting. 20 tablet 0    Prenatal Vit-Fe Fumarate-FA (PRENATAL MULTIVITAMIN) TABS tablet Take 1 tablet by mouth daily at 12 noon.   More than a month   simethicone (GAS-X) 80 MG chewable tablet Chew 1 tablet (80 mg total) by mouth every 6 (six) hours as needed for flatulence. 30 tablet 0     Review of Systems  Constitutional: Negative.  Negative for fever.  HENT: Negative.    Respiratory: Negative.    Gastrointestinal:  Negative for diarrhea and vomiting.  Genitourinary:  Positive for vaginal bleeding. Negative for urgency.  Skin:  Negative for rash.  Neurological: Negative.   Hematological: Negative.   Psychiatric/Behavioral: Negative.    Physical Exam   Blood pressure 132/88, pulse 96, temperature 98.1 F (36.7 C), temperature source Oral, resp. rate 18, unknown if currently breastfeeding.  Physical Exam Constitutional:  Appearance: Normal appearance.  HENT:     Mouth/Throat:     Mouth: Mucous membranes are moist.  Abdominal:     General: Abdomen is flat.  Genitourinary:    General: Normal vulva.     Comments: NEFG; clear fluid in vagina with mucous, no cyst or abscess noted,  Skin:    General: Skin is warm.     Capillary Refill: Capillary refill takes less than 2 seconds.  Neurological:     General: No focal deficit present.     Mental Status: She is alert.    MAU Course  Procedures  MDM -positive fern test -cervix is 3 cm -NST: 120 bpm, mod var, present acel, neg acel, irregular ctx q 3-4  Assessment and Plan  -RN to call patient's community provider; will plan to admit to L and D   Marylene Land 10/30/2021, 12:59 AM

## 2021-10-30 NOTE — Anesthesia Postprocedure Evaluation (Signed)
Anesthesia Post Note  Patient: Diana Farrell  Procedure(s) Performed: AN AD [place]     Patient location during evaluation: Mother Baby Anesthesia Type: Epidural Level of consciousness: awake and alert Pain management: pain level controlled Vital Signs Assessment: post-procedure vital signs reviewed and stable Respiratory status: spontaneous breathing, nonlabored ventilation and respiratory function stable Cardiovascular status: stable Postop Assessment: no headache, no backache, epidural receding, no apparent nausea or vomiting, patient able to bend at knees, adequate PO intake and able to ambulate Anesthetic complications: no   No notable events documented.  Last Vitals:  Vitals:   10/30/21 1431 10/30/21 1439  BP: (!) 157/95 (!) 144/90  Pulse: (!) 55 (!) 53  Resp: 16 16  Temp:    SpO2:      Last Pain:  Vitals:   10/30/21 1446  TempSrc:   PainSc: 4    Pain Goal: Patients Stated Pain Goal: 3 (10/30/21 1446)                 Jabier Mutton

## 2021-10-30 NOTE — Anesthesia Preprocedure Evaluation (Signed)
Anesthesia Evaluation  Patient identified by MRN, date of birth, ID band Patient awake    Reviewed: Allergy & Precautions, Patient's Chart, lab work & pertinent test results  History of Anesthesia Complications Negative for: history of anesthetic complications  Airway Mallampati: II  TM Distance: >3 FB Neck ROM: Full    Dental no notable dental hx.    Pulmonary neg pulmonary ROS,    Pulmonary exam normal        Cardiovascular negative cardio ROS Normal cardiovascular exam     Neuro/Psych negative neurological ROS  negative psych ROS   GI/Hepatic negative GI ROS, Neg liver ROS,   Endo/Other  negative endocrine ROS  Renal/GU negative Renal ROS  negative genitourinary   Musculoskeletal negative musculoskeletal ROS (+)   Abdominal   Peds  Hematology negative hematology ROS (+)   Anesthesia Other Findings Day of surgery medications reviewed with patient.  Reproductive/Obstetrics (+) Pregnancy                             Anesthesia Physical Anesthesia Plan  ASA: 2  Anesthesia Plan: Epidural   Post-op Pain Management:    Induction:   PONV Risk Score and Plan: Treatment may vary due to age or medical condition  Airway Management Planned: Natural Airway  Additional Equipment: Fetal Monitoring  Intra-op Plan:   Post-operative Plan:   Informed Consent: I have reviewed the patients History and Physical, chart, labs and discussed the procedure including the risks, benefits and alternatives for the proposed anesthesia with the patient or authorized representative who has indicated his/her understanding and acceptance.       Plan Discussed with:   Anesthesia Plan Comments:         Anesthesia Quick Evaluation  

## 2021-10-30 NOTE — Progress Notes (Signed)
Postpartum Note - late entry due to patient care  Called by RN regarding approximately 400cc of clots expressed with fundal check. Bladder had just been emptied via straight catheter for 700cc. Verbal order given for IM methergine.   I assessed patient at bedside. I performed a bimanual exam and evacuated approximately 300cc of clot from lower uterine segment. TXA 1g ordered.   Subsequent fundal checks significantly improved. Vitals stable. Continue to monitor.  EBL from delivery, approximately from postpartum   M. Timothy Lasso, MD 10/30/21 3:13 PM

## 2021-10-30 NOTE — Progress Notes (Signed)
At 1330 Pt is unable to void. Moderate Lochia, FF midline 2 fingers above umbilicus. Pt is I & O cath for 650 ml urine-tolerated well. FF@ U /p cath. Lochia moderate and one small clot. Dr  Timothy Lasso is notified. Received order for Methergine.  Pt was anxious about the injection. Methergine is given at 1352. At 1405 FF @ U increase bright red lochia and clots. Current EBL 415,ml Dr Timothy Lasso is notified. At 1410 Dr Timothy Lasso examines pt and evacuates large clots an additional EBL -total EBL  since 1330.  1415 FF 1 below umbilicus. TXA was hung at 1413 and ends at 1424. At 1431 BP 157/95 and at 1439  BP 144/90 FF 1 below umbilicus and small lochia are reported to Dr Pincus Sanes new orders received.

## 2021-10-31 ENCOUNTER — Encounter (HOSPITAL_COMMUNITY)
Admission: AD | Disposition: A | Discharge status: Home / Self Care | Payer: Self-pay | Source: Home / Self Care | Attending: Obstetrics and Gynecology

## 2021-10-31 ENCOUNTER — Inpatient Hospital Stay (HOSPITAL_COMMUNITY): Payer: Medicaid Other | Admitting: Anesthesiology

## 2021-10-31 ENCOUNTER — Other Ambulatory Visit: Payer: Self-pay

## 2021-10-31 ENCOUNTER — Encounter (HOSPITAL_COMMUNITY): Payer: Self-pay | Admitting: Obstetrics

## 2021-10-31 DIAGNOSIS — Z302 Encounter for sterilization: Secondary | ICD-10-CM

## 2021-10-31 HISTORY — PX: TUBAL LIGATION: SHX77

## 2021-10-31 LAB — CBC
HCT: 26.4 % — ABNORMAL LOW (ref 36.0–46.0)
Hemoglobin: 8.6 g/dL — ABNORMAL LOW (ref 12.0–15.0)
MCH: 25.5 pg — ABNORMAL LOW (ref 26.0–34.0)
MCHC: 32.6 g/dL (ref 30.0–36.0)
MCV: 78.3 fL — ABNORMAL LOW (ref 80.0–100.0)
Platelets: 276 10*3/uL (ref 150–400)
RBC: 3.37 MIL/uL — ABNORMAL LOW (ref 3.87–5.11)
RDW: 15.8 % — ABNORMAL HIGH (ref 11.5–15.5)
WBC: 13.3 10*3/uL — ABNORMAL HIGH (ref 4.0–10.5)
nRBC: 0 % (ref 0.0–0.2)

## 2021-10-31 SURGERY — LIGATION, FALLOPIAN TUBE, POSTPARTUM
Anesthesia: Epidural | Laterality: Bilateral | Wound class: Clean Contaminated

## 2021-10-31 MED ORDER — METOCLOPRAMIDE HCL 10 MG PO TABS
10.0000 mg | ORAL_TABLET | Freq: Once | ORAL | Status: DC
  Filled 2021-10-31: qty 1

## 2021-10-31 MED ORDER — FENTANYL CITRATE (PF) 250 MCG/5ML IJ SOLN
INTRAMUSCULAR | Status: AC
  Filled 2021-10-31: qty 5

## 2021-10-31 MED ORDER — MIDAZOLAM HCL 2 MG/2ML IJ SOLN
INTRAMUSCULAR | Status: DC | PRN
  Administered 2021-10-31: 1 mg via INTRAVENOUS

## 2021-10-31 MED ORDER — ONDANSETRON HCL 4 MG/2ML IJ SOLN
INTRAMUSCULAR | Status: AC
  Filled 2021-10-31: qty 2

## 2021-10-31 MED ORDER — SODIUM CHLORIDE 0.9 % IR SOLN
Status: DC | PRN
Start: 2021-10-31 — End: 2021-10-31
  Administered 2021-10-31: 1000 mL

## 2021-10-31 MED ORDER — LACTATED RINGERS IV SOLN: INTRAVENOUS | Status: DC

## 2021-10-31 MED ORDER — FENTANYL CITRATE (PF) 100 MCG/2ML IJ SOLN
INTRAMUSCULAR | Status: DC | PRN
Start: 2021-10-31 — End: 2021-10-31
  Administered 2021-10-31 (×2): 50 ug via INTRAVENOUS
  Administered 2021-10-31: 100 ug via INTRAVENOUS

## 2021-10-31 MED ORDER — ACETAMINOPHEN 10 MG/ML IV SOLN
INTRAVENOUS | Status: AC
  Filled 2021-10-31: qty 100

## 2021-10-31 MED ORDER — BUPIVACAINE IN DEXTROSE 0.75-8.25 % IT SOLN
INTRATHECAL | Status: DC | PRN
  Administered 2021-10-31: 9.75 mg via INTRATHECAL

## 2021-10-31 MED ORDER — HYDROMORPHONE HCL 1 MG/ML IJ SOLN: 0.2500 mg | INTRAMUSCULAR | Status: DC | PRN

## 2021-10-31 MED ORDER — BUPIVACAINE HCL (PF) 0.25 % IJ SOLN
INTRAMUSCULAR | Status: AC
  Filled 2021-10-31: qty 30

## 2021-10-31 MED ORDER — KETOROLAC TROMETHAMINE 30 MG/ML IJ SOLN
INTRAMUSCULAR | Status: DC | PRN
  Administered 2021-10-31: 30 mg via INTRAVENOUS

## 2021-10-31 MED ORDER — LACTATED RINGERS IV SOLN: INTRAVENOUS | Status: DC | PRN

## 2021-10-31 MED ORDER — KETOROLAC TROMETHAMINE 30 MG/ML IJ SOLN
INTRAMUSCULAR | Status: AC
  Filled 2021-10-31: qty 1

## 2021-10-31 MED ORDER — OXYCODONE HCL 5 MG PO TABS: 5.0000 mg | ORAL_TABLET | Freq: Once | ORAL | Status: DC | PRN

## 2021-10-31 MED ORDER — ONDANSETRON HCL 4 MG/2ML IJ SOLN: 4.0000 mg | Freq: Once | INTRAMUSCULAR | Status: DC | PRN

## 2021-10-31 MED ORDER — FAMOTIDINE 20 MG PO TABS
40.0000 mg | ORAL_TABLET | Freq: Once | ORAL | Status: DC
  Filled 2021-10-31: qty 2

## 2021-10-31 MED ORDER — ACETAMINOPHEN 10 MG/ML IV SOLN
INTRAVENOUS | Status: DC | PRN
  Administered 2021-10-31: 1000 mg via INTRAVENOUS

## 2021-10-31 MED ORDER — LIDOCAINE-EPINEPHRINE (PF) 2 %-1:200000 IJ SOLN
INTRAMUSCULAR | Status: AC
  Filled 2021-10-31: qty 20

## 2021-10-31 MED ORDER — STERILE WATER FOR IRRIGATION IR SOLN
Status: DC | PRN
  Administered 2021-10-31: 1000 mL

## 2021-10-31 MED ORDER — BUPIVACAINE HCL (PF) 0.25 % IJ SOLN
INTRAMUSCULAR | Status: DC | PRN
  Administered 2021-10-31 (×2): 10 mL

## 2021-10-31 MED ORDER — OXYCODONE HCL 5 MG/5ML PO SOLN: 5.0000 mg | Freq: Once | ORAL | Status: DC | PRN

## 2021-10-31 MED ORDER — ONDANSETRON HCL 4 MG/2ML IJ SOLN
INTRAMUSCULAR | Status: DC | PRN
  Administered 2021-10-31: 4 mg via INTRAVENOUS

## 2021-10-31 MED ORDER — KETOROLAC TROMETHAMINE 30 MG/ML IJ SOLN
30.0000 mg | Freq: Once | INTRAMUSCULAR | Status: AC | PRN
  Administered 2021-10-31: 30 mg via INTRAVENOUS

## 2021-10-31 MED ORDER — MIDAZOLAM HCL 2 MG/2ML IJ SOLN
INTRAMUSCULAR | Status: AC
Start: 2021-10-31 — End: ?
  Filled 2021-10-31: qty 2

## 2021-10-31 MED ORDER — SODIUM BICARBONATE 8.4 % IV SOLN
INTRAVENOUS | Status: DC | PRN
  Administered 2021-10-31: 5 mL via EPIDURAL
  Administered 2021-10-31: 3 mL via EPIDURAL

## 2021-10-31 SURGICAL SUPPLY — 28 items
BLADE SURG 10 STRL SS (BLADE) ×2 IMPLANT
CLOTH BEACON ORANGE TIMEOUT ST (SAFETY) ×2 IMPLANT
DERMABOND ADVANCED (GAUZE/BANDAGES/DRESSINGS) ×1
DERMABOND ADVANCED .7 DNX12 (GAUZE/BANDAGES/DRESSINGS) ×1 IMPLANT
DRSG OPSITE POSTOP 3X4 (GAUZE/BANDAGES/DRESSINGS) ×2 IMPLANT
DURAPREP 26ML APPLICATOR (WOUND CARE) ×2 IMPLANT
ELECT REM PT RETURN 9FT ADLT (ELECTROSURGICAL) ×2
ELECTRODE REM PT RTRN 9FT ADLT (ELECTROSURGICAL) ×1 IMPLANT
GLOVE BIO SURGEON STRL SZ 6.5 (GLOVE) ×2 IMPLANT
GLOVE BIOGEL PI IND STRL 6.5 (GLOVE) ×1 IMPLANT
GLOVE BIOGEL PI INDICATOR 6.5 (GLOVE) ×1
GOWN STRL REUS W/TWL LRG LVL3 (GOWN DISPOSABLE) ×4 IMPLANT
NEEDLE HYPO 22GX1.5 SAFETY (NEEDLE) IMPLANT
NS IRRIG 1000ML POUR BTL (IV SOLUTION) ×2 IMPLANT
PACK ABDOMINAL MINOR (CUSTOM PROCEDURE TRAY) ×2 IMPLANT
PENCIL BUTTON HOLSTER BLD 10FT (ELECTRODE) IMPLANT
PROTECTOR NERVE ULNAR (MISCELLANEOUS) ×2 IMPLANT
SPONGE LAP 4X18 RFD (DISPOSABLE) ×2 IMPLANT
SUT GUT PLAIN 0 CT-3 TAN 27 (SUTURE) ×2 IMPLANT
SUT MNCRL AB 4-0 PS2 18 (SUTURE) ×2 IMPLANT
SUT MON AB 4-0 PS1 27 (SUTURE) ×1 IMPLANT
SUT PLAIN 2 0 (SUTURE) ×6
SUT PLAIN ABS 2-0 CT1 27XMFL (SUTURE) IMPLANT
SUT VICRYL 0 UR6 27IN ABS (SUTURE) ×3 IMPLANT
SYR CONTROL 10ML LL (SYRINGE) IMPLANT
TOWEL OR 17X24 6PK STRL BLUE (TOWEL DISPOSABLE) ×4 IMPLANT
TRAY FOLEY CATH SILVER 14FR (SET/KITS/TRAYS/PACK) ×2 IMPLANT
WATER STERILE IRR 1000ML POUR (IV SOLUTION) ×2 IMPLANT

## 2021-10-31 NOTE — Anesthesia Postprocedure Evaluation (Signed)
Anesthesia Post Note  Patient: Diana Farrell  Procedure(s) Performed: POST PARTUM TUBAL LIGATION (Bilateral)     Patient location during evaluation: PACU Anesthesia Type: Spinal Level of consciousness: awake and alert Pain management: pain level controlled Vital Signs Assessment: post-procedure vital signs reviewed and stable Respiratory status: spontaneous breathing, nonlabored ventilation and respiratory function stable Cardiovascular status: blood pressure returned to baseline Postop Assessment: no apparent nausea or vomiting, spinal receding, no headache and no backache Anesthetic complications: no   No notable events documented.  Last Vitals:  Vitals:   10/31/21 1730 10/31/21 1745  BP: 108/78 113/88  Pulse: (!) 57 (!) 57  Resp: 18 18  Temp:    SpO2: 100% 99%    Last Pain:  Vitals:   10/31/21 1735  TempSrc:   PainSc: 3    Pain Goal: Patients Stated Pain Goal: 3 (10/30/21 1446)  LLE Motor Response: Purposeful movement (10/31/21 1745) LLE Sensation: Numbness, Tingling (10/31/21 1745) RLE Motor Response: Purposeful movement (10/31/21 1745) RLE Sensation: Numbness, Tingling (10/31/21 1745)     Epidural/Spinal Function Cutaneous sensation: Tingles (10/31/21 1730), Patient able to flex knees: Yes (10/31/21 1730), Patient able to lift hips off bed: Yes (10/31/21 1730), Back pain beyond tenderness at insertion site: No (10/31/21 1730), Progressively worsening motor and/or sensory loss: No (10/31/21 1730)  Shanda Howells

## 2021-10-31 NOTE — Progress Notes (Signed)
Patient is doing well.  She is ambulating, voiding, tolerating PO.  Pain control is good.  Lochia is appropriate.   She received methergine and TXA yesterday afternoon due to increased bleeding.  Following uterine sweep and clot evacuation, bleeding has been minimal.  Patient feels well.   She strongly desires postpartum tubal ligation today  Vitals:   10/30/21 1645 10/30/21 1900 10/30/21 2234 10/31/21 0448  BP: 131/84 128/72 100/80 113/78  Pulse: 68 64 94 73  Resp: 16 16 16 16   Temp: 98.1 F (36.7 C)  98.5 F (36.9 C) 98.2 F (36.8 C)  TempSrc: Oral  Oral Oral  SpO2: 100% 100% 99% 100%  Weight:      Height:        NAD Fundus firm Ext: trace edema bilaterally  Lab Results  Component Value Date   WBC 13.3 (H) 10/31/2021   HGB 8.6 (L) 10/31/2021   HCT 26.4 (L) 10/31/2021   MCV 78.3 (L) 10/31/2021   PLT 276 10/31/2021    --/--/O POS (02/26 0145)/RImmune  A/P 25 y.o. 03-24-1983 PPD#1. Routine care.    Desires permanent sterilization.  Medicaid tubal consent signed > 30 days ago.  We discussed possible postpartum tubal ligation today vs outpatient interval laparoscopic BTL vs salpingectomy.  She desires to try to proceed with PP BTL today. Discussed in detail risk, benefits, alternatives to procedures.  She understands the risks to include infection, bleeding, damage to surrounding structures (including but not limited to bowel, bladder, ovaries, nerves, vessels), tubal failure resulting in pregnancy, including risk of ectopic pregnancy, need for additional procedures, risk of regret.  She understands and elects to proceed.  NPO at this time   Anticipate discharge tomorrow (SGA infant)  [facility]

## 2021-10-31 NOTE — Op Note (Addendum)
Pre-Operative Diagnosis: desires permanent sterilization  Postoperative Diagnosis: Same  Procedure: Postpartum bilateral tubal ligation  Surgeon: Marlow Baars, MD  Assistant: Derl Barrow, MD  Anesthesia: Spinal anesthesia   Operative Findings:Normal appearing tubes bilaterally  Specimen:Portion of bilateral fallopian tubes sent to pathology.   VHQ:[id number]   Patient was taken to the operating room.  Epidural was dosed, but adequate level was not obtained.  Epidural catheter was removed and spinal anesthesia was placed.  After adequate level was determined for surgery the patient was prepped and draped in the usual sterile fashion.  A small subumbilical incision was made using a 15 blade scalpel. The fascia was entered sharply using Mayo scissors and the peritoneum identified and entered.  The fundus of the uterus was easily located with the surgeon's fingers through the incision and was noted to be free of any adhesions. The left fallopian tube was swept into view so that the midportion of the tube could be grasped with Babcock clamps and delivered. The tube was traced out to fimbriated end.   A window in the mesosalpinx was made with the bovie cautery.  A park[place] tubal ligation was performed using two free tie sutures of 0-plain catgut.  Each segment was doubly tied. A segment of the fallopian tube removed was passed off the field to be sent to pathology.  The process was then repeated with the right fallopian tube.  The right tube was identified, grasped with a Babcock clamp, and traced out to fimbriated end.  A window in the mesosalpinx was made with the bovie cautery.  A [place] tubal ligation was performed using two free tie sutures of 0-plain catgut. Each segment was again doubly tied.  A segment of the right fallopian tube removed was passed off the field to be sent to pathology.  The fascia was closed with 0 vicryl.  There were a few bleeders in the subcutaneous layer that were  controlled with bovie cautery. The skin was reapproximated with subcuticular stitch of 4-0 monocryl.   The patient tolerated the procedure well and was sent to the PACU in good condition. All instrument, sponge and needle counts were correct x 2

## 2021-10-31 NOTE — Anesthesia Preprocedure Evaluation (Addendum)
Anesthesia Evaluation  Patient identified by MRN, date of birth, ID band Patient awake    Reviewed: Allergy & Precautions, NPO status , Patient's Chart, lab work & pertinent test results  History of Anesthesia Complications Negative for: history of anesthetic complications  Airway Mallampati: II  TM Distance: >3 FB Neck ROM: Full    Dental no notable dental hx.    Pulmonary neg pulmonary ROS,    Pulmonary exam normal        Cardiovascular negative cardio ROS Normal cardiovascular exam     Neuro/Psych negative neurological ROS  negative psych ROS   GI/Hepatic negative GI ROS, Neg liver ROS,   Endo/Other  negative endocrine ROS  Renal/GU negative Renal ROS  negative genitourinary   Musculoskeletal negative musculoskeletal ROS (+)   Abdominal   Peds  Hematology negative hematology ROS (+)   Anesthesia Other Findings Day of surgery medications reviewed with patient.  Reproductive/Obstetrics (+) Pregnancy                             Anesthesia Physical  Anesthesia Plan  ASA: 2  Anesthesia Plan: Epidural and Spinal   Post-op Pain Management:    Induction:   PONV Risk Score and Plan: 3 and Treatment may vary due to age or medical condition and Ondansetron  Airway Management Planned: Natural Airway  Additional Equipment: Fetal Monitoring  Intra-op Plan:   Post-operative Plan:   Informed Consent: I have reviewed the patients History and Physical, chart, labs and discussed the procedure including the risks, benefits and alternatives for the proposed anesthesia with the patient or authorized representative who has indicated his/her understanding and acceptance.       Plan Discussed with: CRNA and Anesthesiologist  Anesthesia Plan Comments: (Will try epidural if does not work will switch to spinal)       Anesthesia Quick Evaluation

## 2021-10-31 NOTE — Social Work (Signed)
MOB was referred for history of depression, ADHD and anxiety.   * Referral screened out by Clinical Social Worker because none of the following criteria appear to apply:  ~ History of anxiety/depression during this pregnancy, or of post-partum depression following prior delivery. ~ Diagnosis of anxiety and/or depression within last 3 years. OR * MOB's symptoms currently being treated with medication and/or therapy. Per prenatal records, MOB was prescribed Lexapro at 27w, which she may start postpartum. Symptoms improved during pregnancy. MOB also prescribed Adderall 20mg  PRN.   Please contact the Clinical Social Worker if needs arise, by [facility] request, or if MOB scores greater than 9/yes to question 10 on Edinburgh Postpartum Depression Screen.  Darra Lis, [place] Work [employer]  [phone]

## 2021-10-31 NOTE — Transfer of Care (Signed)
Immediate Anesthesia Transfer of Care Note  Patient: Diana Farrell  Procedure(s) Performed: POST PARTUM TUBAL LIGATION (Bilateral)  Patient Location: PACU  Anesthesia Type:Spinal  Level of Consciousness: awake, alert  and oriented  Airway & Oxygen Therapy: Patient Spontanous Breathing  Post-op Assessment: Report given to RN and Post -op Vital signs reviewed and stable  Post vital signs: Reviewed and stable  Last Vitals:  Vitals Value Taken Time  BP 101/80 10/31/21 1707  Temp    Pulse 76 10/31/21 1710  Resp 18 10/31/21 1710  SpO2 99 % 10/31/21 1710  Vitals shown include unvalidated device data.  Last Pain:  Vitals:   10/31/21 1030  TempSrc:   PainSc: 1       Patients Stated Pain Goal: 3 (10/30/21 1446)  Complications: No notable events documented.

## 2021-10-31 NOTE — Anesthesia Procedure Notes (Signed)
Spinal  Patient location during procedure: OB Start time: 10/31/2021 4:01 PM End time: 10/31/2021 4:05 PM Reason for block: surgical anesthesia Staffing Performed: anesthesiologist  Anesthesiologist: Trevor Iha, MD Preanesthetic Checklist Completed: patient identified, IV checked, risks and benefits discussed, surgical consent, monitors and equipment checked, pre-op evaluation and timeout performed Spinal Block Patient position: sitting Prep: DuraPrep and site prepped and draped Patient monitoring: heart rate, cardiac monitor, continuous pulse ox and blood pressure Approach: midline Location: L3-4 Injection technique: single-shot Needle Needle type: Pencan  Needle gauge: 24 G Needle length: 10 cm Needle insertion depth: 5 cm Assessment Sensory level: T4 Events: CSF return Additional Notes  1 Attempt (s). Pt tolerated procedure well.

## 2021-11-01 MED ORDER — IBUPROFEN 800 MG PO TABS: 800.0000 mg | ORAL_TABLET | Freq: Three times a day (TID) | ORAL | 1 refills | Status: DC | PRN

## 2021-11-01 MED ORDER — DOCUSATE SODIUM 100 MG PO CAPS: 100.0000 mg | ORAL_CAPSULE | Freq: Two times a day (BID) | ORAL | 0 refills | Status: DC

## 2021-11-01 MED ORDER — OXYCODONE HCL 5 MG PO TABS: 5.0000 mg | ORAL_TABLET | Freq: Four times a day (QID) | ORAL | 0 refills | Status: DC | PRN

## 2021-11-01 MED ORDER — ESCITALOPRAM OXALATE 5 MG PO TABS: 5.0000 mg | ORAL_TABLET | Freq: Every day | ORAL | 0 refills | Status: DC

## 2021-11-01 MED ORDER — SIMETHICONE 80 MG PO CHEW: 80.0000 mg | CHEWABLE_TABLET | ORAL | 0 refills | Status: DC | PRN

## 2021-11-01 NOTE — Addendum Note (Signed)
Addendum  created 11/01/21 1318 by Barnet Glasgow, MD   Attestation recorded in [place], [place] filed

## 2021-11-01 NOTE — Progress Notes (Signed)
Patient is doing well.  She is ambulating, voiding, tolerating PO.  Pain control is good.  Lochia is appropriate.   Pain well-controlled from ppBTL procedure yesterday, ready for DC home. Needs refill of lexapro at home, requesting constipation meds as well. Reviewed opiod pain control, ambulation and bowel regimen at home in detail  Vitals:   10/31/21 1820 10/31/21 2015 11/01/21 0005 11/01/21 0435  BP: 122/84 132/83 125/82 (!) 104/56  Pulse: 62 71 69 64  Resp: 17 18 18 16   Temp: 98.2 F (36.8 C) 97.9 F (36.6 C) 98 F (36.7 C) 98 F (36.7 C)  TempSrc: Oral Oral Oral Oral  SpO2: 99%     Weight:      Height:        NAD Fundus firm, well-healing infraumbilical incision Ext: trace edema bilaterally  Lab Results  Component Value Date   WBC 13.3 (H) 10/31/2021   HGB 8.6 (L) 10/31/2021   HCT 26.4 (L) 10/31/2021   MCV 78.3 (L) 10/31/2021   PLT 276 10/31/2021    --/--/O POS (02/26 0145)/RImmune  A/P 26 y.o. [id number] PPD#2/POD#1 s/p ppBTL Routine care.   DC home today with routine f/u  Anani Gu M Jaterrius Ricketson

## 2021-11-01 NOTE — Discharge Summary (Signed)
Postpartum Discharge Summary  Date of Service updated     Patient Name: Diana Farrell DOB: [birth year] MRN: [id number]  Date of admission: 10/30/2021 Delivery date:10/30/2021  Delivering provider: Irene Pap E  Date of discharge: 11/01/2021  Admitting diagnosis: Full-term premature rupture of membranes [O42.92] Intrauterine pregnancy: [redacted]w[redacted]d     Secondary diagnosis:  Principal Problem:   Full-term premature rupture of membranes  Additional problems: ADHD, anxiety, satisfied fertility    Discharge diagnosis: Term Pregnancy Delivered                                              Post partum procedures:postpartum tubal ligation Augmentation: Pitocin Complications: None  Hospital course: Onset of Labor With Vaginal Delivery      26 y.o. yo [phone] at [redacted]w[redacted]d was admitted in Latent Labor on 10/30/2021. Augmented with pitocin for PROM. Patient had an uncomplicated labor course as follows:  Membrane Rupture Time/Date: 9:00 PM ,10/29/2021   Delivery Method:Vaginal, Spontaneous  Episiotomy: None  Lacerations:  None  Patient had an uncomplicated postpartum course.  Postpartum BTL completed PPD#1, uncomplicated, please see operative note. She is ambulating, tolerating a regular diet, passing flatus, and urinating well. Patient is discharged home in stable condition on 11/01/21.  Newborn Data: Birth date:10/30/2021  Birth time:6:10 AM  Gender:Female  Living status:Living  Apgars:7 ,9  Weight:2685 g   Physical exam  Vitals:   10/31/21 1820 10/31/21 2015 11/01/21 0005 11/01/21 0435  BP: 122/84 132/83 125/82 (!) 104/56  Pulse: 62 71 69 64  Resp: 17 18 18 16   Temp: 98.2 F (36.8 C) 97.9 F (36.6 C) 98 F (36.7 C) 98 F (36.7 C)  TempSrc: Oral Oral Oral Oral  SpO2: 99%     Weight:      Height:       General: alert, cooperative, and no distress Lochia: appropriate Uterine Fundus: firm Incision: Healing well with no significant drainage, No significant erythema, Dressing  is clean, dry, and intact DVT Evaluation: No evidence of DVT seen on physical exam. Negative Homan's sign. No cords or calf tenderness. Labs: Lab Results  Component Value Date   WBC 13.3 (H) 10/31/2021   HGB 8.6 (L) 10/31/2021   HCT 26.4 (L) 10/31/2021   MCV 78.3 (L) 10/31/2021   PLT 276 10/31/2021   CMP Latest Ref Rng & Units 10/20/2021  Glucose 70 - 99 mg/dL 85  BUN 6 - 20 mg/dL 5(L)  Creatinine 0.44 - 1.00 mg/dL 0.50  Sodium 135 - 145 mmol/L 139  Potassium 3.5 - 5.1 mmol/L 3.8  Chloride 98 - 111 mmol/L 108  CO2 22 - 32 mmol/L 23  Calcium 8.9 - 10.3 mg/dL 8.6(L)  Total Protein 6.5 - 8.1 g/dL 5.6(L)  Total Bilirubin 0.3 - 1.2 mg/dL 0.3  Alkaline Phos 38 - 126 U/L 185(H)  AST 15 - 41 U/L 15  ALT 0 - 44 U/L 11   Edinburgh Score: Edinburgh Postnatal Depression Scale Screening Tool 11/01/2021  I have been able to laugh and see the funny side of things. 2  I have looked forward with enjoyment to things. 1  I have blamed myself unnecessarily when things went wrong. 1  I have been anxious or worried for no good reason. 3  I have felt scared or panicky for no good reason. 3  Things have been getting on top of me. 1  I have been so unhappy that I have had difficulty sleeping. 0  I have felt sad or miserable. 1  I have been so unhappy that I have been crying. 1  The thought of harming myself has occurred to me. 0  Edinburgh Postnatal Depression Scale Total 13      After visit meds:  Allergies as of 11/01/2021   No Known Allergies      Medication List     STOP taking these medications    FLUoxetine 20 MG tablet Commonly known as: PROZAC   ondansetron 8 MG disintegrating tablet Commonly known as: ZOFRAN-ODT       TAKE these medications    acetaminophen 325 MG tablet Commonly known as: TYLENOL Take 650 mg by mouth every 6 (six) hours as needed.   Adderall XR 20 MG 24 hr capsule Generic drug: amphetamine-dextroamphetamine Take 20 mg by mouth every morning.    amphetamine-dextroamphetamine 10 MG tablet Commonly known as: ADDERALL Take 10 mg by mouth daily.   docusate sodium 100 MG capsule Commonly known as: COLACE Take 1 capsule (100 mg total) by mouth 2 (two) times daily.   escitalopram 5 MG tablet Commonly known as: LEXAPRO Take 1 tablet (5 mg total) by mouth daily.   ibuprofen 800 MG tablet Commonly known as: ADVIL Take 1 tablet (800 mg total) by mouth every 8 (eight) hours as needed.   oxyCODONE 5 MG immediate release tablet Commonly known as: Oxy IR/ROXICODONE Take 1 tablet (5 mg total) by mouth every 6 (six) hours as needed for severe pain (pain scale 4-7).   prenatal multivitamin Tabs tablet Take 1 tablet by mouth daily at 12 noon.   simethicone 80 MG chewable tablet Commonly known as: MYLICON Chew 1 tablet (80 mg total) by mouth as needed for flatulence. What changed: when to take this         Discharge home in stable condition Infant Feeding: Bottle Infant Disposition:home with mother Discharge instruction: per After Visit Summary and Postpartum booklet. Activity: Advance as tolerated. Pelvic rest for 6 weeks.  Diet: routine diet Anticipated Birth Control: BTL done PP Postpartum Appointment:6 weeks Follow up Visit: [facility]    11/01/2021 Deliah Boston, MD

## 2021-11-01 NOTE — Social Work (Signed)
CSW received consult due to score of 13 on the Edinburgh Postpartum Depression Scale.   ° °CSW met with MOB to assess and provide support. CSW observed infant sleeping in bassinet and FOB also present. CSW was provided permission to speak with MOB alone. CSW informed MOB of the reason for consult. MOB was polite and engaged with CSW. MOB reported she was diagnosed with anxiety, depression and PTSD in 2018 following the death of her fiance. MOB stated that she had a good pregnancy and did not experience any symptoms of her diagnosis. MOB shared she has started taking Lexapro 5mg and plans to continue postpartum. MOB confirmed she also takes Adderral 20mg as needed. MOB disclosed that she hopes starting prescription medication will assist with her feelings regarding her relationship. MOB stated she has never been to therapy, however she expressed interest in resources for couples therapy. MOB expressed she is currently doing well and bonding with baby 'Claire.' MOB identified FOB and her mother as primary supports postpartum. MOB denies any current SI, HI or being involved in DV.  ° °CSW provided education regarding perinatal mood disorders and the baby blues period. CSW provided MOB with counseling resources and the New Mom Checklist, encouraging self evaluation. MOB identified [facility] for follow-up care and denies any barriers to care. MOB is enrolled in WIC services and provided permission for referrals to additional resources. CSW contacted [facility] [place] to make a referral. MOB denies any additional stressors or needs at this time.  ° °CSW identifies no further need for intervention and no barriers to discharge at this time. ° °Nancyann Cotterman, LCSWA °Clinical Social Work °[facility] °[phone] ° ° ° °

## 2021-11-02 DIAGNOSIS — Z419 Encounter for procedure for purposes other than remedying health state, unspecified: Secondary | ICD-10-CM | POA: Diagnosis not present

## 2021-11-02 LAB — SURGICAL PATHOLOGY

## 2021-11-09 ENCOUNTER — Telehealth (HOSPITAL_COMMUNITY): Payer: Self-pay

## 2021-11-09 NOTE — Telephone Encounter (Signed)
?  No answer. Left message to return nurse call. ? Heron Nay ?11/09/2021,1808 ?

## 2021-11-17 DIAGNOSIS — D2361 Other benign neoplasm of skin of right upper limb, including shoulder: Secondary | ICD-10-CM | POA: Diagnosis not present

## 2021-12-03 DIAGNOSIS — Z419 Encounter for procedure for purposes other than remedying health state, unspecified: Secondary | ICD-10-CM | POA: Diagnosis not present

## 2022-01-02 DIAGNOSIS — Z419 Encounter for procedure for purposes other than remedying health state, unspecified: Secondary | ICD-10-CM | POA: Diagnosis not present

## 2022-01-04 DIAGNOSIS — F900 Attention-deficit hyperactivity disorder, predominantly inattentive type: Secondary | ICD-10-CM | POA: Diagnosis not present

## 2022-01-04 DIAGNOSIS — F819 Developmental disorder of scholastic skills, unspecified: Secondary | ICD-10-CM | POA: Diagnosis not present

## 2022-02-02 DIAGNOSIS — Z419 Encounter for procedure for purposes other than remedying health state, unspecified: Secondary | ICD-10-CM | POA: Diagnosis not present

## 2022-03-04 DIAGNOSIS — Z419 Encounter for procedure for purposes other than remedying health state, unspecified: Secondary | ICD-10-CM | POA: Diagnosis not present

## 2022-04-03 DIAGNOSIS — F900 Attention-deficit hyperactivity disorder, predominantly inattentive type: Secondary | ICD-10-CM | POA: Diagnosis not present

## 2022-04-04 DIAGNOSIS — Z419 Encounter for procedure for purposes other than remedying health state, unspecified: Secondary | ICD-10-CM | POA: Diagnosis not present

## 2022-05-05 DIAGNOSIS — Z419 Encounter for procedure for purposes other than remedying health state, unspecified: Secondary | ICD-10-CM | POA: Diagnosis not present

## 2022-05-17 DIAGNOSIS — Z113 Encounter for screening for infections with a predominantly sexual mode of transmission: Secondary | ICD-10-CM | POA: Diagnosis not present

## 2022-05-17 DIAGNOSIS — S3141XA Laceration without foreign body of vagina and vulva, initial encounter: Secondary | ICD-10-CM | POA: Diagnosis not present

## 2022-05-17 DIAGNOSIS — N760 Acute vaginitis: Secondary | ICD-10-CM | POA: Diagnosis not present

## 2022-05-23 DIAGNOSIS — F900 Attention-deficit hyperactivity disorder, predominantly inattentive type: Secondary | ICD-10-CM | POA: Diagnosis not present

## 2022-05-23 DIAGNOSIS — F418 Other specified anxiety disorders: Secondary | ICD-10-CM | POA: Diagnosis not present

## 2022-05-24 DIAGNOSIS — F321 Major depressive disorder, single episode, moderate: Secondary | ICD-10-CM | POA: Diagnosis not present

## 2022-05-24 DIAGNOSIS — F411 Generalized anxiety disorder: Secondary | ICD-10-CM | POA: Diagnosis not present

## 2022-06-04 DIAGNOSIS — Z419 Encounter for procedure for purposes other than remedying health state, unspecified: Secondary | ICD-10-CM | POA: Diagnosis not present

## 2022-06-05 DIAGNOSIS — F419 Anxiety disorder, unspecified: Secondary | ICD-10-CM | POA: Diagnosis not present

## 2022-06-12 DIAGNOSIS — F419 Anxiety disorder, unspecified: Secondary | ICD-10-CM | POA: Diagnosis not present

## 2022-06-21 DIAGNOSIS — F4322 Adjustment disorder with anxiety: Secondary | ICD-10-CM | POA: Diagnosis not present

## 2022-06-21 DIAGNOSIS — F418 Other specified anxiety disorders: Secondary | ICD-10-CM | POA: Diagnosis not present

## 2022-06-21 DIAGNOSIS — F900 Attention-deficit hyperactivity disorder, predominantly inattentive type: Secondary | ICD-10-CM | POA: Diagnosis not present

## 2022-06-21 DIAGNOSIS — F419 Anxiety disorder, unspecified: Secondary | ICD-10-CM | POA: Diagnosis not present

## 2022-07-05 DIAGNOSIS — Z113 Encounter for screening for infections with a predominantly sexual mode of transmission: Secondary | ICD-10-CM | POA: Diagnosis not present

## 2022-07-05 DIAGNOSIS — Z419 Encounter for procedure for purposes other than remedying health state, unspecified: Secondary | ICD-10-CM | POA: Diagnosis not present

## 2022-07-19 DIAGNOSIS — F418 Other specified anxiety disorders: Secondary | ICD-10-CM | POA: Diagnosis not present

## 2022-07-19 DIAGNOSIS — F900 Attention-deficit hyperactivity disorder, predominantly inattentive type: Secondary | ICD-10-CM | POA: Diagnosis not present

## 2022-08-04 DIAGNOSIS — Z419 Encounter for procedure for purposes other than remedying health state, unspecified: Secondary | ICD-10-CM | POA: Diagnosis not present

## 2022-08-07 DIAGNOSIS — F331 Major depressive disorder, recurrent, moderate: Secondary | ICD-10-CM | POA: Diagnosis not present

## 2022-08-14 DIAGNOSIS — F418 Other specified anxiety disorders: Secondary | ICD-10-CM | POA: Diagnosis not present

## 2022-08-14 DIAGNOSIS — F900 Attention-deficit hyperactivity disorder, predominantly inattentive type: Secondary | ICD-10-CM | POA: Diagnosis not present

## 2022-09-04 DIAGNOSIS — Z419 Encounter for procedure for purposes other than remedying health state, unspecified: Secondary | ICD-10-CM | POA: Diagnosis not present

## 2022-09-14 DIAGNOSIS — F331 Major depressive disorder, recurrent, moderate: Secondary | ICD-10-CM | POA: Diagnosis not present

## 2022-10-05 DIAGNOSIS — Z419 Encounter for procedure for purposes other than remedying health state, unspecified: Secondary | ICD-10-CM | POA: Diagnosis not present

## 2022-10-11 DIAGNOSIS — F331 Major depressive disorder, recurrent, moderate: Secondary | ICD-10-CM | POA: Diagnosis not present

## 2022-11-03 DIAGNOSIS — Z419 Encounter for procedure for purposes other than remedying health state, unspecified: Secondary | ICD-10-CM | POA: Diagnosis not present

## 2022-12-04 DIAGNOSIS — Z419 Encounter for procedure for purposes other than remedying health state, unspecified: Secondary | ICD-10-CM | POA: Diagnosis not present

## 2023-01-03 DIAGNOSIS — Z419 Encounter for procedure for purposes other than remedying health state, unspecified: Secondary | ICD-10-CM | POA: Diagnosis not present

## 2023-01-12 DIAGNOSIS — F331 Major depressive disorder, recurrent, moderate: Secondary | ICD-10-CM | POA: Diagnosis not present

## 2023-02-03 DIAGNOSIS — Z419 Encounter for procedure for purposes other than remedying health state, unspecified: Secondary | ICD-10-CM | POA: Diagnosis not present

## 2023-03-05 DIAGNOSIS — Z419 Encounter for procedure for purposes other than remedying health state, unspecified: Secondary | ICD-10-CM | POA: Diagnosis not present

## 2023-03-14 DIAGNOSIS — F331 Major depressive disorder, recurrent, moderate: Secondary | ICD-10-CM | POA: Diagnosis not present

## 2023-04-05 DIAGNOSIS — Z419 Encounter for procedure for purposes other than remedying health state, unspecified: Secondary | ICD-10-CM | POA: Diagnosis not present

## 2023-04-19 DIAGNOSIS — H6691 Otitis media, unspecified, right ear: Secondary | ICD-10-CM | POA: Diagnosis not present

## 2023-04-19 DIAGNOSIS — H60501 Unspecified acute noninfective otitis externa, right ear: Secondary | ICD-10-CM | POA: Diagnosis not present

## 2023-04-19 DIAGNOSIS — Z113 Encounter for screening for infections with a predominantly sexual mode of transmission: Secondary | ICD-10-CM | POA: Diagnosis not present

## 2023-05-06 DIAGNOSIS — Z419 Encounter for procedure for purposes other than remedying health state, unspecified: Secondary | ICD-10-CM | POA: Diagnosis not present

## 2023-05-31 DIAGNOSIS — F331 Major depressive disorder, recurrent, moderate: Secondary | ICD-10-CM | POA: Diagnosis not present

## 2023-06-05 DIAGNOSIS — Z419 Encounter for procedure for purposes other than remedying health state, unspecified: Secondary | ICD-10-CM | POA: Diagnosis not present

## 2023-07-06 DIAGNOSIS — Z419 Encounter for procedure for purposes other than remedying health state, unspecified: Secondary | ICD-10-CM | POA: Diagnosis not present

## 2023-08-05 DIAGNOSIS — Z419 Encounter for procedure for purposes other than remedying health state, unspecified: Secondary | ICD-10-CM | POA: Diagnosis not present

## 2023-09-05 DIAGNOSIS — Z419 Encounter for procedure for purposes other than remedying health state, unspecified: Secondary | ICD-10-CM | POA: Diagnosis not present

## 2023-09-28 DIAGNOSIS — F331 Major depressive disorder, recurrent, moderate: Secondary | ICD-10-CM | POA: Diagnosis not present

## 2023-10-06 DIAGNOSIS — Z419 Encounter for procedure for purposes other than remedying health state, unspecified: Secondary | ICD-10-CM | POA: Diagnosis not present

## 2023-11-03 DIAGNOSIS — Z419 Encounter for procedure for purposes other than remedying health state, unspecified: Secondary | ICD-10-CM | POA: Diagnosis not present

## 2023-12-15 DIAGNOSIS — Z419 Encounter for procedure for purposes other than remedying health state, unspecified: Secondary | ICD-10-CM | POA: Diagnosis not present

## 2024-01-14 DIAGNOSIS — Z419 Encounter for procedure for purposes other than remedying health state, unspecified: Secondary | ICD-10-CM | POA: Diagnosis not present

## 2024-01-16 DIAGNOSIS — F909 Attention-deficit hyperactivity disorder, unspecified type: Secondary | ICD-10-CM | POA: Diagnosis not present

## 2024-01-16 DIAGNOSIS — R45851 Suicidal ideations: Secondary | ICD-10-CM | POA: Diagnosis not present

## 2024-01-16 DIAGNOSIS — F3181 Bipolar II disorder: Secondary | ICD-10-CM | POA: Diagnosis not present

## 2024-01-16 DIAGNOSIS — F3160 Bipolar disorder, current episode mixed, unspecified: Secondary | ICD-10-CM | POA: Diagnosis not present

## 2024-01-16 DIAGNOSIS — Z91148 Patient's other noncompliance with medication regimen for other reason: Secondary | ICD-10-CM | POA: Diagnosis not present

## 2024-01-17 DIAGNOSIS — F332 Major depressive disorder, recurrent severe without psychotic features: Secondary | ICD-10-CM | POA: Diagnosis not present

## 2024-01-18 DIAGNOSIS — F332 Major depressive disorder, recurrent severe without psychotic features: Secondary | ICD-10-CM | POA: Diagnosis not present

## 2024-01-19 DIAGNOSIS — F332 Major depressive disorder, recurrent severe without psychotic features: Secondary | ICD-10-CM | POA: Diagnosis not present

## 2024-01-20 DIAGNOSIS — F332 Major depressive disorder, recurrent severe without psychotic features: Secondary | ICD-10-CM | POA: Diagnosis not present

## 2024-03-09 ENCOUNTER — Ambulatory Visit (HOSPITAL_COMMUNITY)
Admission: EM | Admit: 2024-03-09 | Discharge: 2024-03-12 | Disposition: A | Discharge status: Home / Self Care | Attending: Urology | Admitting: Urology

## 2024-03-09 DIAGNOSIS — F419 Anxiety disorder, unspecified: Secondary | ICD-10-CM | POA: Diagnosis not present

## 2024-03-09 DIAGNOSIS — F909 Attention-deficit hyperactivity disorder, unspecified type: Secondary | ICD-10-CM | POA: Insufficient documentation

## 2024-03-09 DIAGNOSIS — Z79899 Other long term (current) drug therapy: Secondary | ICD-10-CM | POA: Insufficient documentation

## 2024-03-09 DIAGNOSIS — F3112 Bipolar disorder, current episode manic without psychotic features, moderate: Secondary | ICD-10-CM | POA: Insufficient documentation

## 2024-03-09 DIAGNOSIS — R21 Rash and other nonspecific skin eruption: Secondary | ICD-10-CM | POA: Insufficient documentation

## 2024-03-09 DIAGNOSIS — F1210 Cannabis abuse, uncomplicated: Secondary | ICD-10-CM | POA: Insufficient documentation

## 2024-03-09 DIAGNOSIS — R000 Tachycardia, unspecified: Secondary | ICD-10-CM | POA: Insufficient documentation

## 2024-03-09 DIAGNOSIS — F15951 Other stimulant use, unspecified with stimulant-induced psychotic disorder with hallucinations: Secondary | ICD-10-CM | POA: Insufficient documentation

## 2024-03-09 DIAGNOSIS — D72829 Elevated white blood cell count, unspecified: Secondary | ICD-10-CM | POA: Insufficient documentation

## 2024-03-09 DIAGNOSIS — Z91410 Personal history of adult physical and sexual abuse: Secondary | ICD-10-CM | POA: Insufficient documentation

## 2024-03-09 MED ORDER — TRAZODONE HCL 50 MG PO TABS
50.0000 mg | ORAL_TABLET | Freq: Every evening | ORAL | Status: DC | PRN
  Administered 2024-03-10 – 2024-03-11 (×3): 50 mg via ORAL
  Filled 2024-03-09 (×3): qty 1

## 2024-03-09 MED ORDER — HYDROXYZINE HCL 25 MG PO TABS
25.0000 mg | ORAL_TABLET | Freq: Three times a day (TID) | ORAL | Status: DC | PRN
  Administered 2024-03-10 – 2024-03-11 (×2): 25 mg via ORAL
  Filled 2024-03-09 (×2): qty 1

## 2024-03-09 MED ORDER — DIPHENHYDRAMINE HCL 50 MG/ML IJ SOLN
50.0000 mg | Freq: Three times a day (TID) | INTRAMUSCULAR | Status: DC | PRN
  Filled 2024-03-09: qty 1

## 2024-03-09 MED ORDER — ACETAMINOPHEN 325 MG PO TABS: 650.0000 mg | ORAL_TABLET | Freq: Four times a day (QID) | ORAL | Status: DC | PRN

## 2024-03-09 MED ORDER — MAGNESIUM HYDROXIDE 400 MG/5ML PO SUSP: 30.0000 mL | Freq: Every day | ORAL | Status: DC | PRN

## 2024-03-09 MED ORDER — DIPHENHYDRAMINE HCL 50 MG/ML IJ SOLN
50.0000 mg | Freq: Three times a day (TID) | INTRAMUSCULAR | Status: DC | PRN
  Administered 2024-03-11: 50 mg via INTRAMUSCULAR
  Filled 2024-03-09: qty 1

## 2024-03-09 MED ORDER — LORAZEPAM 2 MG/ML IJ SOLN: 2.0000 mg | Freq: Three times a day (TID) | INTRAMUSCULAR | Status: DC | PRN

## 2024-03-09 MED ORDER — ALUM & MAG HYDROXIDE-SIMETH 200-200-20 MG/5ML PO SUSP: 30.0000 mL | ORAL | Status: DC | PRN

## 2024-03-09 MED ORDER — DIPHENHYDRAMINE HCL 50 MG PO CAPS
50.0000 mg | ORAL_CAPSULE | Freq: Three times a day (TID) | ORAL | Status: DC | PRN
  Administered 2024-03-10 (×2): 50 mg via ORAL
  Filled 2024-03-09 (×3): qty 1

## 2024-03-09 MED ORDER — HALOPERIDOL 5 MG PO TABS
5.0000 mg | ORAL_TABLET | Freq: Three times a day (TID) | ORAL | Status: DC | PRN
  Administered 2024-03-10 (×2): 5 mg via ORAL
  Filled 2024-03-09 (×2): qty 1

## 2024-03-09 MED ORDER — HALOPERIDOL LACTATE 5 MG/ML IJ SOLN: 10.0000 mg | Freq: Three times a day (TID) | INTRAMUSCULAR | Status: DC | PRN

## 2024-03-09 MED ORDER — HALOPERIDOL LACTATE 5 MG/ML IJ SOLN: 5.0000 mg | Freq: Three times a day (TID) | INTRAMUSCULAR | Status: DC | PRN

## 2024-03-09 NOTE — ED Provider Notes (Signed)
 [facility] Continuous Assessment Admission H&P  Date: 03/09/24 Patient Name: Diana Farrell MRN: [id number] Chief Complaint: I need evaluation and I need to get back on a mood stabilizer  Diagnoses:  Final diagnoses:  None    HPI: Iness Utley is a female with a psychiatric history significant for bipolar disorder, borderline personality disorder, anxiety, and cannabis use disorder. She was brought voluntarily to [facility] by the [facility] Department for mental health evaluation and requesting a mood stabilizer after [place].   Patient was evaluated face to face and her chart was reviewed by this NP. On assessment, patient reports a recent inpatient admission at [facility] approximately 1.5 months ago, during which she was started on a mood stabilizer, though she is unable to recall the name of the medication. She states that the medication prescribed at [facility] was effective, but since transitioning care to [facility], her psychiatric provider changed her regimen. Per medication dispensary information, she is currently prescribed Vraylar  1.5 mg daily, Adderall 30 mg daily, Trazodone  50 mg daily, Gabapentin  300 mg twice daily, and Prozac  20 mg daily. Patient admits to not taking Vraylar  due to dislike of the medication but reports compliance with all other prescribed medications. She says she wants to change to a different mood stabilizer due to ongoing emotional instability, which has recently led to interpersonal conflicts with her parents. She reports having "trouble at home today," but declines to provide further details.  Patient has three children who reside with her and her parents.   On physical examination, multiple scratch marks, scabs, and bruising are noted on her legs and thighs, which she attributes to walking in the woods and through bushes. She currently denies suicidal ideation, homicidal ideation, or auditory/visual  hallucinations. She admits to smoking approximately two grams of marijuana twice weekly and consuming one to two alcoholic drinks twice per month.  Patient is alert and orient x 4. She is cooperative but somewhat restless throughout the interview. She displays hypomanic symptoms, including increased talkativeness, noticeable restlessness, fidgeting, and circumstantial thought processes. She reports her mood as "unstable," affect is somewhat labile. Thought processes is coherent and goal-directed despite occasional circumstantiality, and there is no evidence of psychosis or delusional thinking. She denies suicidal or homicidal ideation and reports no auditory or visual hallucinations. Insight is fair. Judgment appears intact. Speech is increased in rate but comprehensible. Cognition is intact.    Total Time spent with patient: 45 minutes  Musculoskeletal  Strength & Muscle Tone: within normal limits Gait & Station: normal Patient leans: Right  Psychiatric Specialty Exam  Presentation General Appearance:  Appropriate for Environment  Eye Contact: Good  Speech: Clear and Coherent  Speech Volume: Normal  Handedness: Right   Mood and Affect  Mood: Anxious  Affect: Congruent   Thought Process  Thought Processes: Coherent  Descriptions of Associations:Intact  Orientation:Full (Time, Place and Person)  Thought Content:WDL    Hallucinations:Hallucinations: None  Ideas of Reference:None  Suicidal Thoughts:Suicidal Thoughts: No  Homicidal Thoughts:Homicidal Thoughts: No   Sensorium  Memory: Immediate Good; Recent Good; Remote Good  Judgment: Fair  Insight: Fair   Art therapist  Concentration: Fair  Attention Span: Poor  Recall: Fair  Fund of Knowledge: Good  Language: Good   Psychomotor Activity  Psychomotor Activity: Psychomotor Activity: Normal   Assets  Assets: Communication Skills; Desire for Improvement; Housing; Physical  Health   Sleep  Sleep: Sleep: Fair Number of Hours of Sleep: 5  Nutritional Assessment (For OBS and FBC admissions only) Has the patient had a weight loss or gain of 10 pounds or more in the last 3 months?: No Has the patient had a decrease in food intake/or appetite?: No Does the patient have dental problems?: No Does the patient have eating habits or behaviors that may be indicators of an eating disorder including binging or inducing vomiting?: No Has the patient recently lost weight without trying?: 0 Has the patient been eating poorly because of a decreased appetite?: 0 Malnutrition Screening Tool Score: 0    Physical Exam Vitals and nursing note reviewed.  Constitutional:      General: She is not in acute distress.    Appearance: She is well-developed. She is not ill-appearing, toxic-appearing or diaphoretic.  HENT:     Head: Normocephalic and atraumatic.  Eyes:     Conjunctiva/sclera: Conjunctivae normal.  Cardiovascular:     Rate and Rhythm: Normal rate.  Pulmonary:     Effort: Pulmonary effort is normal.  Abdominal:     Palpations: Abdomen is soft.     Tenderness: There is no abdominal tenderness.  Musculoskeletal:        General: Normal range of motion.     Cervical back: Normal range of motion.  Skin:    Findings: Bruising present.  Neurological:     Mental Status: She is alert and oriented to person, place, and time.  Psychiatric:        Attention and Perception: Attention and perception normal.        Mood and Affect: Mood is anxious. Affect is labile.        Speech: Speech is rapid and pressured.        Behavior: Behavior is hyperactive. Behavior is cooperative.        Thought Content: Thought content normal.    Review of Systems  Constitutional: Negative.   HENT: Negative.    Eyes: Negative.   Respiratory: Negative.    Cardiovascular: Negative.   Gastrointestinal: Negative.   Genitourinary: Negative.   Musculoskeletal: Negative.   Skin:  Negative.   Neurological: Negative.   Endo/Heme/Allergies: Negative.   Psychiatric/Behavioral:  Positive for substance abuse. The patient is nervous/anxious.     Blood pressure (!) 138/96, pulse 97, temperature 97.8 F (36.6 C), temperature source Oral, resp. rate 20, SpO2 97%, unknown if currently breastfeeding. There is no height or weight on file to calculate BMI.  Past Psychiatric History: ipolar disorder, borderline personality disorder, anxiety, and cannabis use disorder.   Is the patient at risk to self? No  Has the patient been a risk to self in the past 6 months? No .    Has the patient been a risk to self within the distant past? No   Is the patient a risk to others? No   Has the patient been a risk to others in the past 6 months? No   Has the patient been a risk to others within the distant past? No   Past Medical History:  Past Medical History:  Diagnosis Date   Anemia, antepartum(648.23)    Other abscess of vulva    SVD (spontaneous vaginal delivery) 08/05/2013     Family History:  Family History  Adopted: Yes  Family history unknown: Yes     Social History:  cannabis use disorder.  Last Labs:  No visits with results within 6 Month(s) from this visit.  Latest known visit with results is:  Admission on 10/30/2021, Discharged on 11/01/2021  Component Date Value Ref Range Status   WBC 10/30/2021 11.6 (H)  4.0 - 10.5 K/uL Final   RBC 10/30/2021 4.02  3.87 - 5.11 MIL/uL Final   Hemoglobin 10/30/2021 10.0 (L)  12.0 - 15.0 g/dL Final   HCT 97/73/7976 32.4 (L)  36.0 - 46.0 % Final   MCV 10/30/2021 80.6  80.0 - 100.0 fL Final   MCH 10/30/2021 24.9 (L)  26.0 - 34.0 pg Final   MCHC 10/30/2021 30.9  30.0 - 36.0 g/dL Final   RDW 97/73/7976 15.8 (H)  11.5 - 15.5 % Final   Platelets 10/30/2021 348  150 - 400 K/uL Final   nRBC 10/30/2021 0.0  0.0 - 0.2 % Final   Performed at [facility], [address] [place], [place] [address]   ABO/RH(D) 10/30/2021 O POS    Final   Antibody Screen 10/30/2021 NEG   Final   Sample Expiration 10/30/2021    Final                   Value:11/02/2021,2359 Performed at [facility], [address] [place], [place] [address]    RPR Ser Ql 10/30/2021 NON REACTIVE  NON REACTIVE Final   Performed at [facility], [address], [place], [place] [address]   HIV-1 P24 Antigen - HIV24 10/30/2021 NON REACTIVE  NON REACTIVE Final   Comment: (NOTE) Detection of p24 may be inhibited by biotin in the sample, causing false negative results in acute infection.    HIV 1/2 Antibodies 10/30/2021 NON REACTIVE  NON REACTIVE Final   Interpretation (HIV Ag Ab) 10/30/2021 A non reactive test result means that HIV 1 or HIV 2 antibodies and HIV 1 p24 antigen were not detected in the specimen.   Final   Performed at [facility], [address] [place], [place] [address]   Gonorrhea 04/19/2021 Negative   Final   Chlamydia 04/19/2021 Negative   Final   RPR 04/19/2021 Nonreactive   Final   HIV 04/19/2021 Non-reactive   Final   Rubella 04/19/2021 Immune   Final   Hepatitis B Surface Ag 04/19/2021 Negative   Final   HCV Ab 04/19/2021 Negative   Final   GBS 10/20/2021 Negative   Final   WBC 10/31/2021 13.3 (H)  4.0 - 10.5 K/uL Final   RBC 10/31/2021 3.37 (L)  3.87 - 5.11 MIL/uL Final   Hemoglobin 10/31/2021 8.6 (L)  12.0 - 15.0 g/dL Final   HCT 97/72/7976 26.4 (L)  36.0 - 46.0 % Final   MCV 10/31/2021 78.3 (L)  80.0 - 100.0 fL Final   MCH 10/31/2021 25.5 (L)  26.0 - 34.0 pg Final   MCHC 10/31/2021 32.6  30.0 - 36.0 g/dL Final   RDW 97/72/7976 15.8 (H)  11.5 - 15.5 % Final   Platelets 10/31/2021 276  150 - 400 K/uL Final   nRBC 10/31/2021 0.0  0.0 - 0.2 % Final   Performed at [facility], [address] [place], [place] [address]   SURGICAL PATHOLOGY 10/31/2021    Final-Edited                   Value:SURGICAL PATHOLOGY CASE: [id number] PATIENT: COLEY DOLLAR Surgical Pathology Report     Clinical  History: 38w 1d; BTL, desires sterilization (crm)   FINAL MICROSCOPIC DIAGNOSIS:  A. FALLOPIAN TUBE, BILATERAL, TUBAL LIGATION: -  Complete cross-sections of fallopian tubes   GROSS DESCRIPTION:  The specimen is received  fresh and consists of 2 tubular segments of tan-pink soft tissue, measuring 0.6 cm in length by 0.4 cm in diameter. The segments are differentially inked, bisected, and entirely submitted in 1 cassette.  SHIRLEEN 11/01/2021)   Final Diagnosis performed by Stephane Arts, MD.   Electronically signed 11/02/2021 Technical component performed at [facility], [address], [place], [place] [address].  Professional component performed at [facility], [address] [place], [place] [address].  Immunohistochemistry Technical component (if applicable) was performed at [facility] [address], [place], [place] [address].   IMMUNOHISTOCHEMISTRY DISCLAIMER (if applicable): Some of these immunohistochemical stains may have been developed and the performance characteristics determine by [facility]. Some may not have been cleared or approved by the U.S. Food and Drug Administration. The FDA has determined that such clearance or approval is not necessary. This test is used for clinical purposes. It should not be regarded as investigational or for research. This laboratory is certified under the Clinical Laboratory Improvement Amendments of 1988 (CLIA-88) as qualified to perform high complexity clinical laboratory testing.  The controls stained appropriately.     Allergies: Patient has no known allergies.  Medications:  Facility Ordered Medications  Medication   acetaminophen  (TYLENOL ) tablet 650 mg   alum & mag hydroxide-simeth (MAALOX/MYLANTA) 200-200-20 MG/5ML suspension 30 mL   magnesium  hydroxide (MILK OF MAGNESIA) suspension 30 mL   haloperidol  (HALDOL ) tablet 5  mg   And   diphenhydrAMINE  (BENADRYL ) capsule 50 mg   haloperidol  lactate (HALDOL ) injection 5 mg   And   diphenhydrAMINE  (BENADRYL ) injection 50 mg   And   LORazepam  (ATIVAN ) injection 2 mg   haloperidol  lactate (HALDOL ) injection 10 mg   And   diphenhydrAMINE  (BENADRYL ) injection 50 mg   And   LORazepam  (ATIVAN ) injection 2 mg   hydrOXYzine  (ATARAX ) tablet 25 mg   traZODone  (DESYREL ) tablet 50 mg   PTA Medications  Medication Sig   acetaminophen  (TYLENOL ) 325 MG tablet Take 650 mg by mouth every 6 (six) hours as needed.   Prenatal Vit-Fe Fumarate-FA (PRENATAL MULTIVITAMIN) TABS tablet Take 1 tablet by mouth daily at 12 noon.   ADDERALL XR 20 MG 24 hr capsule Take 20 mg by mouth every morning.   amphetamine -dextroamphetamine  (ADDERALL) 10 MG tablet Take 10 mg by mouth daily.   oxyCODONE  (OXY IR/ROXICODONE ) 5 MG immediate release tablet Take 1 tablet (5 mg total) by mouth every 6 (six) hours as needed for severe pain (pain scale 4-7).   docusate sodium  (COLACE) 100 MG capsule Take 1 capsule (100 mg total) by mouth 2 (two) times daily.   simethicone  (MYLICON) 80 MG chewable tablet Chew 1 tablet (80 mg total) by mouth as needed for flatulence.   escitalopram  (LEXAPRO ) 5 MG tablet Take 1 tablet (5 mg total) by mouth daily.   ibuprofen  (ADVIL ) 800 MG tablet Take 1 tablet (800 mg total) by mouth every 8 (eight) hours as needed.      Medical Decision Making  Patient will be admitted to the observation unit for continuous assessment. I discussed starting Abilify  5 mg daily to help stabilize the patient's mood, and she was agreeable to this plan. We reviewed resuming her home medications with the exception of Prozac . I explained the rationale for holding the antidepressant (Prozac ) at this time  due to her current hypomanic symptoms and the risk of triggering mania. The risks and benefits of the medication adjustments were thoroughly discussed with the patient, and she was given the  opportunity to express any concerns or questions, which were addressed accordingly.  Lab Orders         SARS Coronavirus 2 by RT PCR (hospital order, performed in [facility] hospital lab) *cepheid single result test* Anterior Nasal Swab         CBC with Differential/Platelet         Comprehensive metabolic panel         Hemoglobin A1c         Ethanol         TSH         POC urine preg, ED         POCT Urine Drug Screen - (I-Screen)     Awaiting urine pregnancy test result to resume Adderall     Recommendations  Based on my evaluation the patient does not appear to have an emergency medical condition.  Kathryne DELENA Show, NP 03/09/24  10:01 PM

## 2024-03-09 NOTE — Progress Notes (Signed)
   03/09/24 1931  [facility] Triage Screening (Walk-ins at [facility] only)  How Did You Hear About Us ? Legal System  What Is the Reason for Your Visit/Call Today? Pt presents to [facility] as a voluntary walk-in, accompanied by [employer] requesting medication consultation. Pt was picked up by [employer] and wanted to be evaluated. Pt reports hx of anxiety MDD. Pt reports being prescribed medication by her psychiatrist at [facility], but does not seem to remember what she was prescribed. Pt is not compliant with medication regimen. Pt currently denies SI,HI,AVH.  How Long Has This Been Causing You Problems? <Week  Have You Recently Had Any Thoughts About Hurting Yourself? No  Are You Planning to Commit Suicide/Harm Yourself At This time? No  Have you Recently Had Thoughts About Hurting Someone Sherral? No  Are You Planning To Harm Someone At This Time? No  Physical Abuse Denies  Verbal Abuse Denies  Sexual Abuse Denies  Exploitation of patient/patient's resources Denies  Self-Neglect Denies  Are you currently experiencing any auditory, visual or other hallucinations? No  Have You Used Any Alcohol or Drugs in the Past 24 Hours? Yes  What Did You Use and How Much? marijuana  Do you have any current medical co-morbidities that require immediate attention? No  Clinician description of patient physical appearance/behavior: casually dressed in shorts, t-shirt, wet clothing as she was picked up in the community in the rain  What Do You Feel Would Help You the Most Today? Medication(s)  If access to [facility] was not available, would you have sought care in the Emergency Department? No  Determination of Need Routine (7 days)  Options For Referral Other: Comment;Medication Management;Outpatient Therapy

## 2024-03-09 NOTE — ED Notes (Signed)
 Diana Farrell arrived to the [facility] for observation and medication consultation. Patient is A&Ox4 . Patient states what brought them into the facility is that  I was taking a medication before with my provider and  I changed providers and the new provider discontinued that medication that was helping me . Patient throughout  nursing assessment had disorganized thoughts, difficulty completing sentences, and loose association throughout assessment.Patient denies any suicidal ideations or homicidal ideations. Pt contracts for safety. Patient denies auditory hallucinations, visual hallucinations, or CAH. Skin check conducted by Corean PEAK and Duck, MHT. Patient oriented to the unit and provided food, beverage, and snack. Patient verbalized understanding. No distress noted.

## 2024-03-10 LAB — COMPREHENSIVE METABOLIC PANEL WITH GFR
ALT: 17 U/L (ref 0–44)
AST: 26 U/L (ref 15–41)
Albumin: 4.6 g/dL (ref 3.5–5.0)
Alkaline Phosphatase: 77 U/L (ref 38–126)
Anion gap: 14 (ref 5–15)
BUN: 10 mg/dL (ref 6–20)
CO2: 19 mmol/L — ABNORMAL LOW (ref 22–32)
Calcium: 9.6 mg/dL (ref 8.9–10.3)
Chloride: 102 mmol/L (ref 98–111)
Creatinine, Ser: 0.58 mg/dL (ref 0.44–1.00)
GFR, Estimated: >60 mL/min (ref >60)
Glucose, Bld: 75 mg/dL (ref 70–99)
Potassium: 4.1 mmol/L (ref 3.5–5.1)
Sodium: 135 mmol/L (ref 135–145)
Total Bilirubin: 0.9 mg/dL (ref 0.0–1.2)
Total Protein: 8.4 g/dL — ABNORMAL HIGH (ref 6.5–8.1)

## 2024-03-10 LAB — CBC WITH DIFFERENTIAL/PLATELET
Abs Immature Granulocytes: 0.09 K/uL — ABNORMAL HIGH (ref 0.00–0.07)
Basophils Absolute: 0 K/uL (ref 0.0–0.1)
Basophils Relative: 0 %
Eosinophils Absolute: 0 K/uL (ref 0.0–0.5)
Eosinophils Relative: 0 %
HCT: 41.8 % (ref 36.0–46.0)
Hemoglobin: 14.1 g/dL (ref 12.0–15.0)
Immature Granulocytes: 0 %
Lymphocytes Relative: 15 %
Lymphs Abs: 3 K/uL (ref 0.7–4.0)
MCH: 29.3 pg (ref 26.0–34.0)
MCHC: 33.7 g/dL (ref 30.0–36.0)
MCV: 86.7 fL (ref 80.0–100.0)
Monocytes Absolute: 1.1 K/uL — ABNORMAL HIGH (ref 0.1–1.0)
Monocytes Relative: 5 %
Neutro Abs: 16 K/uL — ABNORMAL HIGH (ref 1.7–7.7)
Neutrophils Relative %: 80 %
Platelets: 418 K/uL — ABNORMAL HIGH (ref 150–400)
RBC: 4.82 MIL/uL (ref 3.87–5.11)
RDW: 13 % (ref 11.5–15.5)
WBC: 20.1 K/uL — ABNORMAL HIGH (ref 4.0–10.5)
nRBC: 0 % (ref 0.0–0.2)

## 2024-03-10 LAB — HEMOGLOBIN A1C
Hgb A1c MFr Bld: 4.8 % (ref 4.8–5.6)
Mean Plasma Glucose: 91.06 mg/dL

## 2024-03-10 LAB — TSH: TSH: 3.53 u[IU]/mL (ref 0.350–4.500)

## 2024-03-10 LAB — ETHANOL: Alcohol, Ethyl (B): <15 mg/dL (ref <15)

## 2024-03-10 MED ORDER — GABAPENTIN 300 MG PO CAPS
300.0000 mg | ORAL_CAPSULE | Freq: Two times a day (BID) | ORAL | Status: DC
  Administered 2024-03-10 – 2024-03-12 (×5): 300 mg via ORAL
  Filled 2024-03-10 (×5): qty 1

## 2024-03-10 MED ORDER — TRAZODONE HCL 50 MG PO TABS
50.0000 mg | ORAL_TABLET | Freq: Once | ORAL | Status: AC
  Administered 2024-03-10: 50 mg via ORAL
  Filled 2024-03-10: qty 1

## 2024-03-10 MED ORDER — LAMOTRIGINE 25 MG PO TABS
25.0000 mg | ORAL_TABLET | Freq: Every day | ORAL | Status: DC
  Administered 2024-03-10 – 2024-03-12 (×2): 25 mg via ORAL
  Filled 2024-03-10 (×3): qty 1

## 2024-03-10 MED ORDER — ARIPIPRAZOLE 5 MG PO TABS
5.0000 mg | ORAL_TABLET | Freq: Every day | ORAL | Status: DC
  Administered 2024-03-10 – 2024-03-11 (×2): 5 mg via ORAL
  Filled 2024-03-10 (×2): qty 1

## 2024-03-10 NOTE — BH Assessment (Addendum)
 Comprehensive Clinical Assessment (CCA) Note   03/10/2024 Diana Farrell [id number]   Disposition: [name], NP recommends continuous observation.   The patient demonstrates the following risk factors for suicide: Chronic risk factors for suicide include: psychiatric disorder of Major Depressive Disorder. Acute risk factors for suicide include: social withdrawal/isolation. Protective factors for this patient include: positive social support. Considering these factors, the overall suicide risk at this point appears to be low. Patient is not appropriate for outpatient follow up.   Pt presents to [facility] as a voluntary walk-in, accompanied by [employer] requesting medication consultation. Pt was picked up by [employer] and wanted to be evaluated. Pt reports hx of anxiety MDD. Pt reports being prescribed medication by her psychiatrist at [facility], but does not seem to remember what she was prescribed. Pt is not compliant with medication regimen. Pt currently denies SI,HI,AVH.  Upon evaluation with this clinician, the patient is alert, oriented x 3, and cooperative. Pt presents with rambling speech and flight of ideas. Difficult to follow throughout the assessment. Pt appears casual. Eye contact is fair. Mood is anxious and depressed; affect is congruent with mood. Pt denies SI/HI/AVH. There is no indication that the patient is responding to internal stimuli. No delusions elicited during this assessment.   Pt appears to be under the influence.    Chief Complaint:  Chief Complaint  Patient presents with   Medication Consultation   Visit Diagnosis: Major Depressive Disorder     CCA Screening, Triage and Referral (STR)  Patient Reported Information How did you hear about us ? Legal System  What Is the Reason for Your Visit/Call Today? Pt presents to [facility] as a voluntary walk-in, accompanied by [employer] requesting medication consultation. Pt was picked up by [employer] and wanted to be evaluated. Pt reports hx of anxiety MDD.  Pt reports being prescribed medication by her psychiatrist at [facility], but does not seem to remember what she was prescribed. Pt is not compliant with medication regimen. Pt currently denies SI,HI,AVH.  How Long Has This Been Causing You Problems? <Week  What Do You Feel Would Help You the Most Today? Medication(s)   Have You Recently Had Any Thoughts About Hurting Yourself? No  Are You Planning to Commit Suicide/Harm Yourself At This time? No   Flowsheet Row ED from 03/09/2024 in [facility] Admission (Discharged) from 10/30/2021 in [facility] 4S Mother Baby Unit Admission (Discharged) from 10/20/2021 in [facility] 1S Maternity Assessment Unit  C-SSRS RISK CATEGORY No Risk No Risk No Risk    Have you Recently Had Thoughts About Hurting Someone [place]? No  Are You Planning to Harm Someone at This Time? No  Explanation: Denies HI   Have You Used Any Alcohol or Drugs in the Past 24 Hours? Yes  How Long Ago Did You Use Drugs or Alcohol? UTA What Did You Use and How Much? marijuana   Do You Currently Have a Therapist/Psychiatrist? Yes  Name of Therapist/Psychiatrist: Name of Therapist/Psychiatrist: [facility]   Have You Been Recently Discharged From Any Office Practice or Programs? No  Explanation of Discharge From Practice/Program: n/a   CCA Screening Triage Referral Assessment Type of Contact: Face-to-Face  Telemedicine Service Delivery:   Is this Initial or Reassessment?   Date Telepsych consult ordered in [facility]:    Time Telepsych consult ordered in [facility]:    Location of Assessment: [facility] Assessment Services  Provider Location: [facility] Assessment Services   Collateral Involvement: none   Does Patient Have a Automotive engineer Guardian?  No  Legal Guardian Contact Information: n/a  Copy of Legal Guardianship Form: -- (n/a)  Legal Guardian Notified of Arrival: -- (n/a)  Legal Guardian Notified of Pending Discharge: -- (n/a)  If Minor and Not Living  with Parent(s), Who has Custody? n/a  Is CPS involved or ever been involved? Never  Is APS involved or ever been involved? Never   Patient Determined To Be At Risk for Harm To Self or Others Based on Review of Patient Reported Information or Presenting Complaint? No  Method: No Plan  Availability of Means: No access or NA  Intent: Vague intent or NA  Notification Required: No need or identified person  Additional Information for Danger to Others Potential: -- (n/a)  Additional Comments for Danger to Others Potential: n/a  Are There Guns or Other Weapons in Your Home? No  Types of Guns/Weapons: Denies access  Are These Weapons Safely Secured?                            No  Who Could Verify You Are Able To Have These Secured: Denies access  Do You Have any Outstanding Charges, Pending Court Dates, Parole/Probation? Denies pending legal charges  Contacted To Inform of Risk of Harm To Self or Others: -- (n/a)    Does Patient Present under Involuntary Commitment? No    [place] of Residence: Diana Farrell   Patient Currently Receiving the Following Services: Medication Management   Determination of Need: Routine (7 days)   Options For Referral: Other: Comment; Medication Management; Outpatient Therapy     CCA Biopsychosocial Patient Reported Schizophrenia/Schizoaffective Diagnosis in Past: No   Strengths: n/a   Mental Health Symptoms Depression:  Change in energy/activity; Difficulty Concentrating   Duration of Depressive symptoms: Duration of Depressive Symptoms: Greater than two weeks   Mania:  None   Anxiety:   Difficulty concentrating; Worrying   Psychosis:  None   Duration of Psychotic symptoms:    Trauma:  None   Obsessions:  None   Compulsions:  None   Inattention:  Forgetful   Hyperactivity/Impulsivity:  None   Oppositional/Defiant Behaviors:  None   Emotional Irregularity:  None   Other Mood/Personality Symptoms:  Disorganized speech  / rambling    Mental Status Exam Appearance and self-care  Stature:  Average   Weight:  Average weight   Clothing:  Casual   Grooming:  Neglected   Cosmetic use:  None   Posture/gait:  Normal   Motor activity:  Restless   Sensorium  Attention:  Confused   Concentration:  Scattered   Orientation:  X5   Recall/memory:  Normal   Affect and Mood  Affect:  Anxious   Mood:  Anxious   Relating  Eye contact:  Normal   Facial expression:  Anxious   Attitude toward examiner:  Cooperative   Thought and Language  Speech flow: Flight of Ideas   Thought content:  Ideas of Influence   Preoccupation:  None   Hallucinations:  None   Organization:  Circumstantial   Company secretary of Knowledge:  Fair   Intelligence:  Average   Abstraction:  Normal   Judgement:  Impaired   Reality Testing:  Adequate   Insight:  Lacking   Decision Making:  Impulsive   Social Functioning  Social Maturity:  Impulsive   Social Judgement:  isolation  Stress  Stressors:  Transitions   Coping Ability:  Exhausted; Overwhelmed   Skill Deficits:  Communication; Interpersonal   Supports:  Support needed     Religion: Religion/Spirituality Are You A Religious Person?: No How Might This Affect Treatment?: n/a  Leisure/Recreation: Leisure / Recreation Do You Have Hobbies?: No  Exercise/Diet: Exercise/Diet Do You Exercise?: No Have You Gained or Lost A Significant Amount of Weight in the Past Six Months?: No Do You Follow a Special Diet?: No Do You Have Any Trouble Sleeping?: No   CCA Employment/Education Employment/Work Situation: Employment / Work Situation Employment Situation: Employed Work Stressors: none reported Patient's Job has Been Impacted by Current Illness: No Has Patient ever Been in Equities trader?: No  Education: Education Is Patient Currently Attending School?: No Last Grade Completed: 12 Did You Product manager?: No Did You Have An  Individualized Education Program (IIEP): No Did You Have Any Difficulty At [employer]?: No Patient's Education Has Been Impacted by Current Illness: No   CCA Family/Childhood History Family and Relationship History: Family history Marital status: Single Does patient have children?: Yes How many children?: 3 How is patient's relationship with their children?: UTA  Childhood History:  Childhood History By whom was/is the patient raised?: Both parents Did patient suffer any verbal/emotional/physical/sexual abuse as a child?: No Did patient suffer from severe childhood neglect?: No Has patient ever been sexually abused/assaulted/raped as an adolescent or adult?: No Was the patient ever a victim of a crime or a disaster?: No Witnessed domestic violence?: No Has patient been affected by domestic violence as an adult?: No       CCA Substance Use Alcohol/Drug Use: Alcohol / Drug Use Pain Medications: See MAR Prescriptions: See MAR Over the Counter: See MAR History of alcohol / drug use?: Yes Longest period of sobriety (when/how long): Unknown; 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 Negative Consequences of Use:  (Denies) Withdrawal Symptoms:  (Denies)                         ASAM's:  Six Dimensions of Multidimensional Assessment  Dimension 1:  Acute Intoxication and/or Withdrawal Potential:      Dimension 2:  Biomedical Conditions and Complications:      Dimension 3:  Emotional, Behavioral, or Cognitive Conditions and Complications:     Dimension 4:  Readiness to Change:     Dimension 5:  Relapse, Continued use, or Continued Problem Potential:     Dimension 6:  Recovery/Living Environment:     ASAM Severity Score:    ASAM Recommended Level of Treatment: ASAM Recommended Level of Treatment: Level I Outpatient Treatment   Substance use Disorder (SUD) Substance Use Disorder (SUD)  Checklist Symptoms of Substance Use: Continued use  despite having a persistent/recurrent physical/psychological problem caused/exacerbated by use  Recommendations for Services/Supports/Treatments: Recommendations for Services/Supports/Treatments Recommendations For Services/Supports/Treatments: Individual Therapy, Medication Management  Disposition Recommendation per psychiatric provider: Continuous observation. Pt is to be seen by psychiatry in AM.    DSM5 Diagnoses: Patient Active Problem List   Diagnosis Date Noted   Full-term premature rupture of membranes 10/30/2021   PROM (premature rupture of membranes) 09/23/2020   S/P laparoscopic appendectomy 02/14/2019     Referrals to Alternative Service(s): Referred to Alternative Service(s):   Place:   Date:   Time:    Referred to Alternative Service(s):   Place:   Date:   Time:    Referred to Alternative Service(s):   Place:   Date:   Time:    Referred to Alternative Service(s):   Place:  Date:   Time:     Rosina PARAS, [place], [facility]

## 2024-03-10 NOTE — ED Notes (Signed)
 Patient exteremly anxious and paranoid. Patient parnoid that family is out to get her and states  I can hear them talking. Patient educated that staff is on the other units talking to other patients and staff. Patient verbalizes  you are going to tell me that am wrong. Verbal de-escalation used and supportive words. Patient requested for something to help her sleep. Provider Shalon NP notified of patients status, previous medications, and request for something to help me sleep. One time dose of Trazodone  added to [facility]. Trazodone  50 mg PO given. Patient currently sleeping and resting in bed. Pt observed/assessed in recliner sleeping. RR even and unlabored, appearing in no noted distress. Environmental check complete

## 2024-03-10 NOTE — ED Notes (Signed)
 Patient resting with eyes closed. Respirations even and unlabored. No distress noted. Environment secured. Plan of care ongoing, no further concerns as of present.

## 2024-03-10 NOTE — ED Notes (Signed)
 Patient is confused, does not know how she got here or why she is here. Requesting medication that she was taking at another facility but cannot recall the name. Getting increasingly irritable as evidenced by sighing, and pursed mouth when talking. Agreed to take PRN PO medication. Denies intent/thoughts to harm self/others when asked. Denies A/VH. Patient denies any physical complaints when asked. No distress noted. Support and encouragement provided. Routine safety checks conducted according to facility protocol. Encouraged patient to notify staff if thoughts of harm toward self or others arise. Endorses safety. Patient verbalized understanding and agreement. Plan of care ongoing, no further concerns as of present. Patient expresses no other needs at this time.

## 2024-03-10 NOTE — ED Notes (Signed)
 Patient observed/assessed at bedside with patient lying in bed. Patient alert and oriented x 4. Affect is flat while patient appears sedated.  Patient denies pain and anxiety. He denies A/V/H. He denies having any thoughts/plan of self harm and harm towards others. Fluid and snack offered. Patient states that appetite has been good throughout the day.  Verbalizes no further complaints at this time. Will continue to monitor and support.

## 2024-03-10 NOTE — Progress Notes (Signed)
 LCSW Progress Note  [id number]   Moana Munford  03/10/2024  12:43 PM  Description:   Inpatient Psychiatric Referral  Patient was recommended inpatient per [name] NP. There are no available beds at [facility], per [facility] Cherylynn Ernst RN. Patient was referred to the following out of network facilities:   Destination  Service Provider Address Phone Fax  [facility]  [address] [place] [address] [phone]  [facility]  [address] [place] [address] [phone]  [facility]  [address], [place] [address] [phone]  [facility] EFAX  [address] [place], [place] [phone]  [facility]  [address] [place] [address] [phone]  [facility]  [address], [place] [address] [phone]  [facility] EFAX          Situation ongoing, CSW to continue following and update chart as more information becomes available.     [place] Yatzil Clippinger, MSW, LCSW  03/10/2024 12:43 PM

## 2024-03-10 NOTE — ED Provider Notes (Signed)
 Behavioral Health Progress Note  Date and Time: 03/10/2024 10:56 AM Name: Diana Farrell MRN:  [id number]  Subjective: On interview, patient is laying in a recliner, attentive.  Her speech is pressured, she is difficult to interrupt, as she goes on numerous tangents.  Occasional word substitution errors and difficulty explaining concepts (my car was low, meaning that because of her stature, she sits very low an her car.) Patient says that she is here to get back on her mood stabilizer medication -- says that [facility] changed her meds, an took her off of her gabapentin  and trazodone , which she is unhappy about.  Has not been taking home Vraylar  1.5 mg.  On discussion, patient endorses that medication she would like to get back on, likely Lamictal  (it causes a rash.)  Would like to restart on this medication.  She was not sure what dose she had been started on, but it was given to her twice a day.  Patient is difficult to follow when asked why she was found on the side of the highway.  It appears that patient got overwhelmed and pulled off to the side of the highway.  Endorses that she ran through the woods but was unable to explain why, constantly trailing off.  Patient then endorses methamphetamine relapse ~ 48 hours ago, which caused the fight with her parents and led to her leaving the house.  Endorses that she did not use meth in front of her 3 children, whom she lives with.  Patient minimizes her methamphetamine use, then says she only does it once a month.  Endorses passive SI 3 days ago, but denies presently.  Denies homicidal ideation.  Denies auditory or visual hallucinations at present -- per overnight nursing note, patient was paranoid about hearing her parents talk about her.  She seems concerned that she will be involuntarily committed.  Denied permission to speak to family members for collateral information.  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  And Per chart review, patient has historical diagnoses of bipolar disorder, borderline personality disorder.  She was recently hospitalized at [facility] 6/11 - 6/19, for a manic episode.  Was started on a medication that is likely to Lamictal .  Sees [facility] outpatient-who stopped patient's Lamictal  an started Vraylar  1.5.  ADHD diagnosis, previously on Adderall. Past Medical History: Has 3 children, Family Psychiatric  History: Patient was adopted from [place] as a child Social History: Lives with parents, endorses occasional alcohol an cannabis use.  Additional Social History:    Pain Medications: See MAR Prescriptions: See MAR Over the Counter: See MAR History of alcohol / drug use?: Yes Longest period of sobriety (when/how long): Unknown; 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 Negative Consequences of Use:  (Denies) Withdrawal Symptoms:  (Denies)                    Sleep: Poor  Appetite:  Poor  Current Medications:  Current Facility-Administered Medications  Medication Dose Route Frequency Provider Last Rate Last Admin   acetaminophen  (TYLENOL ) tablet 650 mg  650 mg Oral Q6H PRN Ajibola, Ene A, NP       alum & mag hydroxide-simeth (MAALOX/MYLANTA) 200-200-20 MG/5ML suspension 30 mL  30 mL Oral Q4H PRN Ajibola, Ene A, NP       ARIPiprazole  (ABILIFY ) tablet 5 mg  5 mg Oral Daily Ajibola, Ene A, NP       haloperidol  (HALDOL ) tablet 5 mg  5 mg Oral TID PRN Ajibola, Ene A, NP   5 mg at 03/10/24 0743   And   diphenhydrAMINE  (BENADRYL ) capsule 50 mg  50 mg Oral TID PRN Ajibola, Ene A, NP   50 mg at 03/10/24 0743   haloperidol  lactate (HALDOL ) injection 5 mg  5 mg Intramuscular TID PRN Ajibola, Ene A, NP       And   diphenhydrAMINE  (BENADRYL ) injection 50 mg  50 mg Intramuscular TID PRN Ajibola,  Ene A, NP       And   LORazepam  (ATIVAN ) injection 2 mg  2 mg Intramuscular TID PRN Ajibola, Ene A, NP       haloperidol  lactate (HALDOL ) injection 10 mg  10 mg Intramuscular TID PRN Ajibola, Ene A, NP       And   diphenhydrAMINE  (BENADRYL ) injection 50 mg  50 mg Intramuscular TID PRN Ajibola, Ene A, NP       And   LORazepam  (ATIVAN ) injection 2 mg  2 mg Intramuscular TID PRN Ajibola, Ene A, NP       gabapentin  (NEURONTIN ) capsule 300 mg  300 mg Oral BID Ajibola, Ene A, NP       hydrOXYzine  (ATARAX ) tablet 25 mg  25 mg Oral TID PRN Ajibola, Ene A, NP   25 mg at 03/10/24 0007   magnesium  hydroxide (MILK OF MAGNESIA) suspension 30 mL  30 mL Oral Daily PRN Ajibola, Ene A, NP       traZODone  (DESYREL ) tablet 50 mg  50 mg Oral QHS PRN Ajibola, Ene A, NP   50 mg at 03/10/24 0007   Current Outpatient Medications  Medication Sig Dispense Refill   amphetamine -dextroamphetamine  (ADDERALL) 30 MG tablet Take 1 tablet by mouth 2 (two) times daily.     FLUoxetine  (PROZAC ) 20 MG capsule Take 20 mg by mouth daily.     gabapentin  (NEURONTIN ) 300 MG capsule Take 300 mg by mouth 2 (two) times daily as needed (For anxiety).     traZODone  (DESYREL ) 50 MG tablet Take 50 mg by mouth at bedtime as needed.     cariprazine  (VRAYLAR ) 1.5 MG capsule Take 1.5 mg by mouth daily. (Patient not taking: Reported on 03/10/2024)      Labs  Lab Results:  Admission on 03/09/2024  Component Date Value Ref Range Status   WBC 03/09/2024 20.1 (H)  4.0 - 10.5 K/uL Final   RBC 03/09/2024 4.82  3.87 - 5.11 MIL/uL Final   Hemoglobin 03/09/2024 14.1  12.0 - 15.0 g/dL Final   HCT 92/93/7974 41.8  36.0 - 46.0 % Final   MCV 03/09/2024 86.7  80.0 - 100.0 fL Final   MCH 03/09/2024 29.3  26.0 - 34.0 pg Final   MCHC 03/09/2024 33.7  30.0 - 36.0 g/dL Final   RDW 92/93/7974 13.0  11.5 - 15.5 % Final   Platelets 03/09/2024 418 (H)  150 - 400 K/uL Final   nRBC 03/09/2024 0.0  0.0 - 0.2 % Final   Neutrophils Relative % 03/09/2024 80  %  Final   Neutro Abs 03/09/2024 16.0 (H)  1.7 - 7.7 K/uL Final   Lymphocytes Relative 03/09/2024 15  % Final   Lymphs Abs 03/09/2024 3.0  0.7 - 4.0 K/uL Final   Monocytes Relative 03/09/2024 5  % Final   Monocytes Absolute 03/09/2024 1.1 (H)  0.1 - 1.0 K/uL Final   Eosinophils  Relative 03/09/2024 0  % Final   Eosinophils Absolute 03/09/2024 0.0  0.0 - 0.5 K/uL Final   Basophils Relative 03/09/2024 0  % Final   Basophils Absolute 03/09/2024 0.0  0.0 - 0.1 K/uL Final   Immature Granulocytes 03/09/2024 0  % Final   Abs Immature Granulocytes 03/09/2024 0.09 (H)  0.00 - 0.07 K/uL Final   Performed at [facility], [address], [place], [place] [address]   Sodium 03/09/2024 135  135 - 145 mmol/L Final   Potassium 03/09/2024 4.1  3.5 - 5.1 mmol/L Final   Chloride 03/09/2024 102  98 - 111 mmol/L Final   CO2 03/09/2024 19 (L)  22 - 32 mmol/L Final   Glucose, Bld 03/09/2024 75  70 - 99 mg/dL Final   Glucose reference range applies only to samples taken after fasting for at least 8 hours.   BUN 03/09/2024 10  6 - 20 mg/dL Final   Creatinine, Ser 03/09/2024 0.58  0.44 - 1.00 mg/dL Final   Calcium 92/93/7974 9.6  8.9 - 10.3 mg/dL Final   Total Protein 92/93/7974 8.4 (H)  6.5 - 8.1 g/dL Final   Albumin 92/93/7974 4.6  3.5 - 5.0 g/dL Final   AST 92/93/7974 26  15 - 41 U/L Final   ALT 03/09/2024 17  0 - 44 U/L Final   Alkaline Phosphatase 03/09/2024 77  38 - 126 U/L Final   Total Bilirubin 03/09/2024 0.9  0.0 - 1.2 mg/dL Final   GFR, Estimated 03/09/2024 >60  >60 mL/min Final   Comment: (NOTE) Calculated using the CKD-EPI Creatinine Equation (2021)    Anion gap 03/09/2024 14  5 - 15 Final   Performed at [facility], [address], [place], [place] [address]   Hgb A1c MFr Bld 03/09/2024 4.8  4.8 - 5.6 % Final   Comment: (NOTE) Diagnosis of Diabetes The following HbA1c ranges recommended by the American Diabetes Association (ADA) may be used as an aid in the diagnosis of diabetes  mellitus.  Hemoglobin             Suggested A1C NGSP%              Diagnosis  <5.7                   Non Diabetic  5.7-6.4                Pre-Diabetic  >6.4                   Diabetic  <7.0                   Glycemic control for                       adults with diabetes.     Mean Plasma Glucose 03/09/2024 91.06  mg/dL Final   Performed at [facility], [address] [place], [place] [address]   Alcohol, Ethyl (B) 03/09/2024 <15  <15 mg/dL Final   Comment: (NOTE) For medical purposes only. Performed at [facility], [address] [place], [place] [address]    TSH 03/09/2024 3.530  0.350 - 4.500 uIU/mL Final   Comment: Performed by a 3rd Generation assay with a functional sensitivity of <=0.01 uIU/mL. Performed at [facility], [address] [place], [place] [address]     Blood Alcohol level:  Lab Results  Component  Value Date   [facility] <15 03/09/2024   ETH <10 11/17/2019    Metabolic Disorder Labs: Lab Results  Component Value Date   HGBA1C 4.8 03/09/2024   MPG 91.06 03/09/2024   No results found for: PROLACTIN No results found for: CHOL, TRIG, HDL, CHOLHDL, VLDL, LDLCALC  Therapeutic Lab Levels: No results found for: LITHIUM No results found for: VALPROATE No results found for: CBMZ  Physical Findings   GAD-7    Flowsheet Row Office Visit from 10/04/2018 in Primary Care at [facility]  Total GAD-7 Score 7   PHQ2-9    Flowsheet Row Office Visit from 10/04/2018 in Primary Care at [facility] Visit from 04/23/2018 in Primary Care at [facility] Visit from 03/13/2018 in Primary Care at [facility] Visit from 03/04/2018 in Primary Care at [facility] Visit from 04/17/2017 in Primary Care at [facility]  PHQ-2 Total Score 0 0 0 0 0   Flowsheet Row ED from 03/09/2024 in [facility] Admission (Discharged) from 10/30/2021 in [facility] 4S Mother Baby Unit Admission (Discharged) from 10/20/2021 in [facility] 1S Maternity  Assessment Unit  C-SSRS RISK CATEGORY No Risk No Risk No Risk     Musculoskeletal  Strength & Muscle Tone: within normal limits Gait & Station: normal Patient leans: N/A  Psychiatric Specialty Exam  Presentation  General Appearance:  Casual; Appropriate for Environment  Eye Contact: Good  Speech: Pressured (difficult to interrupt)  Speech Volume: Normal  Handedness: Right   Mood and Affect  Mood: Euphoric; Anxious  Affect: Congruent   Thought Process  Thought Processes: Other (comment) (largely linear, but occasional tangents, word substitution errors, rapid speech)  Descriptions of Associations:Tangential  Orientation:Full (Time, Place and Person)  Thought Content:Perseveration; Scattered; Tangential  Diagnosis of Schizophrenia or Schizoaffective disorder in past: No    Hallucinations:Hallucinations: None (Told nursing I can hear them talking about me overnight, denied now)  Ideas of Reference:None  Suicidal Thoughts:Suicidal Thoughts: No  Homicidal Thoughts:Homicidal Thoughts: No   Sensorium  Memory: Immediate Poor; Recent Fair  Judgment: Poor  Insight: Poor   Executive Functions  Concentration: Fair  Attention Span: Fair  Recall: [employer] of Knowledge: Fair  Language: Fair   Psychomotor Activity  Psychomotor Activity: Psychomotor Activity: Normal   Assets  Assets: Communication Skills; Desire for Improvement; Housing; Physical Health   Sleep  Sleep: Sleep: Poor (Needed haldol  5 mg x2 overnight for irrtitation, confusion and didn't sleep at all night before, after meth use.)  No Safety Checks orders active in given range  Nutritional Assessment (For OBS and [facility] admissions only) Has the patient had a weight loss or gain of 10 pounds or more in the last 3 months?: No Has the patient had a decrease in food intake/or appetite?: No Does the patient have dental problems?: No Does the patient have eating habits or  behaviors that may be indicators of an eating disorder including binging or inducing vomiting?: No Has the patient recently lost weight without trying?: 0 Has the patient been eating poorly because of a decreased appetite?: 0 Malnutrition Screening Tool Score: 0    Physical Exam  Physical Exam Vitals reviewed.  Constitutional:      General: She is not in acute distress. Pulmonary:     Effort: Pulmonary effort is normal. No respiratory distress.  Neurological:     Mental Status: She is alert.    Review of Systems  Constitutional:  Negative for chills and fever.  All other systems reviewed and are negative.  Blood pressure 108/79, pulse 97, temperature 98.4 F (36.9 C), temperature source Oral, resp. rate 16, SpO2 100%, unknown if currently breastfeeding. There is no height or weight on file to calculate BMI.  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  Was amenable to starting Abilify  and restarting Lamictal .  However, patient is evasive or otherwise unable to convey the events leading to her current presentation.  Was [place] sitting [place] grass [place] [employer].  Has small Nixon cuts on bilateral upper extremity for this.  Is unable to say why she did this.  Did endorse methamphetamine relapse 48 hours ago, which caused tension with her parents, whom she lives with.  She is loosely organized, and denying auditory hallucinations to this provider, but, per nursing note at 3:16 AM 7/7, paranoid that family is out to get her and that she can hear them talking.  Required Haldol  5 mg x2 overnight.  Was confused later in a.m. of 7/7, did not know how she got here or why  she was here. - Patient is appropriate for inpatient hospitalization for acute stabilization of substance-induced psychosis versus manic episode.  Will be placed under involuntary commitment. - Continue Abilify  5 mg daily for now.   - Hold home Prozac  20 mg at the setting of manic versus psychotic episode.  This may not be an appropriate medication for patient with bipolar disorder diagnosis. - Restart Lamictal  25 mg daily for long-term mood stabilization.  # Leukocytosis to 20.1 - Patient denied clinical symptoms of fever.  May be associated with recent methamphetamine use. Will continue to monitor.   Isa Kohlenberg, MD 03/10/2024 10:56 AM

## 2024-03-10 NOTE — Progress Notes (Signed)
 Inpatient Psychiatric Referral  Patient was recommended inpatient per [name], NP . There are no available beds at [facility], per [facility] Cherylynn Ernst, RN . Patient was referred to the following out of network facilities:  Destination  Service Provider Request Status Address Phone Fax  [facility]  Pending - Request Sent [address] [place] [address] [phone]  [facility]  Pending - Request Sent [address], [place] [address] [phone]  [facility]  Pending - Request Sent [address] [place] [address] [phone]  [facility]  Pending - Request Sent [address] [place] [phone]  [facility]  Pending - Request Sent [address] [place] [address] [phone]  [facility]  Pending - Request [facility] [address] [place] [address] [phone]  [facility]  Pending - Request Sent [place] [phone]  [facility]  Pending - Request Sent [address] [place] [address] [phone]  [facility]  Pending - Request [facility], [place] [phone]  [facility]-Adult  Pending - Request Sent [address] [place] [address] [phone]  [facility]  Pending - Request Sent [address] [place], [place] [address] [phone]  [facility]  Pending - Request Sent [address] [place] [address] [phone]  [facility]  Pending - Request Sent [address] [place], [place] [address] [phone]   [facility]  Pending - Request Sent [address], [place] [address] [phone]  [facility]  Pending - Request Sent [address], [place] [address] [phone]  [facility]  Pending - Request Sent [address], [place] [address] [phone]  [facility]  Pending - Request Sent [address] [place] [address] [phone]  [facility]  Pending - Request Sent [address] [place] [address] [phone]    Situation ongoing, CSW to continue following and update chart as more information becomes available.   Harrie Sofia MSW, LCSWA 03/10/2024  6:52PM

## 2024-03-11 DIAGNOSIS — F15951 Other stimulant use, unspecified with stimulant-induced psychotic disorder with hallucinations: Secondary | ICD-10-CM | POA: Diagnosis not present

## 2024-03-11 DIAGNOSIS — F3112 Bipolar disorder, current episode manic without psychotic features, moderate: Secondary | ICD-10-CM

## 2024-03-11 LAB — URINALYSIS, ROUTINE W REFLEX MICROSCOPIC
Bilirubin Urine: NEGATIVE
Glucose, UA: NEGATIVE mg/dL
Hgb urine dipstick: NEGATIVE
Ketones, ur: NEGATIVE mg/dL
Nitrite: NEGATIVE
Protein, ur: NEGATIVE mg/dL
Specific Gravity, Urine: 1.025 (ref 1.005–1.030)
pH: 5 (ref 5.0–8.0)

## 2024-03-11 LAB — PREGNANCY, URINE: Preg Test, Ur: NEGATIVE

## 2024-03-11 LAB — POCT URINE DRUG SCREEN - MANUAL ENTRY (I-SCREEN)
POC Amphetamine UR: NOT DETECTED
POC Buprenorphine (BUP): NOT DETECTED
POC Cocaine UR: NOT DETECTED
POC Marijuana UR: POSITIVE — AB
POC Methadone UR: NOT DETECTED
POC Methamphetamine UR: POSITIVE — AB
POC Morphine: NOT DETECTED
POC Oxazepam (BZO): NOT DETECTED
POC Oxycodone UR: NOT DETECTED
POC Secobarbital (BAR): NOT DETECTED

## 2024-03-11 LAB — POC URINE PREG, ED: Preg Test, Ur: NEGATIVE

## 2024-03-11 LAB — RAPID URINE DRUG SCREEN, HOSP PERFORMED
Amphetamines: POSITIVE — AB
Barbiturates: NOT DETECTED
Benzodiazepines: NOT DETECTED
Cocaine: NOT DETECTED
Opiates: NOT DETECTED
Tetrahydrocannabinol: POSITIVE — AB

## 2024-03-11 NOTE — ED Notes (Signed)
 Approximately 1020, Pt reported that she was having problems talking  after taking scheduled ability .  She stated she feels like her mouth is swollen, slight drooling noted. Denies difficulty swallowing, no swallowing difficulty noted or observed.   No shortness of breath or wheezing noted at present.   Providers notified,  Benadryl  50 mg IM administered at 1104. with much encouragement as patient reports she is very afraid and triggered by injections

## 2024-03-11 NOTE — ED Notes (Signed)
 Patient is getting agitated stating I've seen about 15 people leave here black and white and I'm still here Patient continues to ask when she is leaving multiple times. This Clinical research associate and nurse have told her that each person is different and once we know an update we will let her know.

## 2024-03-11 NOTE — ED Notes (Signed)
 Patient alert and oriented x 3. Patient continues to ask questions regarding wanting to leave and if she has placement. Patient verbalizes no further complaints at this time. Patient denies S/I, H/I, and A/V/H currently. States appetite has been good. Continuing to monitor.

## 2024-03-11 NOTE — ED Notes (Signed)
 Patient observed/assessed in bed/chair resting quietly appearing in no distress and verbalizing no complaints at this time. Will continue to monitor.

## 2024-03-11 NOTE — Progress Notes (Signed)
 Inpatient Psychiatric Referral  Patient was recommended inpatient per Morene Cleveland MD. There are no available beds at [facility], per [facility]. Patient was referred to the following out of network facilities:   Destination  Service Provider Request Status Address Phone Fax  [facility]  Pending - Request Sent [address] [place] [address] [phone]  [facility]  Pending - Request Sent [address], [place] [address] [phone]  [facility]  Pending - Request Sent [address] [place] [address] [phone]  [facility]  Pending - Request Sent [address] [place] [phone]  [facility]  Pending - Request Sent [address] [place] [address] [phone]  [facility]  Pending - Request [facility] [address], [place] [address] [phone]  [facility]patient [facility]  Pending - Request Sent [place] [phone]  [facility]  Pending - Request Sent [address] [place] [address] [phone]  [facility] Patient Placement  Pending - Request [facility], [place] [phone]  [facility] Center-Adult  Pending - Request Sent [address] [place] [address] [phone]  [facility]  Pending - Request Sent [address] [place], [place] [address] [phone]  [facility]  Pending - Request Sent [address] [place] [address] [phone]  [facility]  Pending - Request Sent [address] [place], [place] [address] [phone]  [facility]  Pending - Request Sent [address], [place] [address] [phone]  [facility]  Pending - Request Sent [address], [place] [address] [phone]  [facility]  Pending - Request Sent [address], [place] [address] [phone]  [facility]  Pending - Request Sent [address] [place] [address] [phone]  [facility]  Pending - Request Sent [address], [place] [address] [phone]  [facility]  Pending - Request Sent [address], [place] [address] [phone]  [facility]  Pending - Request Sent [address] [place] [address] [phone]  [facility]  Pending - Request Sent [place] [phone]  [facility]  Pending - Request Sent [address] [facility] [address] [place] [address] [phone]  [facility]  Pending - Request [place] [address] [phone]    Situation ongoing, CSW to continue following and update chart as more information becomes available.  Harrie Sofia MSW, LCSWA 03/11/2024  11:22PM

## 2024-03-11 NOTE — Progress Notes (Signed)
 LCSW Progress Note  [id number]   Diana Farrell  03/11/2024  10:08 AM  Description:   Inpatient Psychiatric Referral  Patient was recommended inpatient per Morene Cleveland MD. There are no available beds at [facility], per [facility] Cherylynn Ernst RN. Patient was referred to the following out of network facilities:   Destination  Service Provider Address Phone  [facility]  [address] [place] [address] [phone]  [facility]  [address], [place] [address] [phone]  [facility]  [address] [place] [address] [phone]  [facility] EFAX  [address], [place] [phone]  [facility]  [address] [place] [address] [phone]  [facility]  [address] [place] [address] [phone]  [facility]  [place] [phone]  [facility]  [address] [place] [address] [phone]  [facility]  [address] [place] [address] [phone]  [facility]  [address] [place], [place] [address] [phone]  [facility]  [address] [place] [address] [phone]  [facility]  [address] [place], [place] [address] [phone]  [facility]  [address], [place] [address] [phone]  [facility]  [address], [place] [address] [phone]  [facility]  [address] [place] [address] [phone]  [facility]  [address] [place] [address] [phone]  [facility]  [address] [place] [address] [phone]      Situation ongoing, CSW to continue following  and update chart as more information becomes available.      [place] Zalen Sequeira MSW, LCSW  03/11/2024 10:08 AM

## 2024-03-11 NOTE — ED Notes (Signed)
 Pt  presents sitting in recliner having breakfast.  Pt states she has been here for 3 days and feels like going to a facility will only make things worse.  States she might have some behaviors should she have to go to a facility.  Discussed appropriate behaviors and consequences of actions. Denied current SI plan and intent

## 2024-03-11 NOTE — ED Notes (Signed)
 Reassessment:  Benadryl  50 mg IM  Pt no longer having difficulty speaking, reports her mouth no longer feels swollen.  No drooling noted.  Reports feeling better.

## 2024-03-11 NOTE — ED Provider Notes (Signed)
 Behavioral Health Progress Note  Date and Time: 03/11/2024 11:08 AM Name: Diana Farrell MRN:  [id number]  Subjective: On interview, patient is alert an oriented.  Is frustrated throughout interview, and says that she can discharge to a rehab place.  Exhibits rapid speech and profanity, largely against this provider.  Discussed process of involuntary commitment-patient says that I saw my mom on the screen an the nurses station, talking to a judge. Is perseverative that it is my right to have been part of the meeting which placed patient under involuntary commitment.  When informed that this process did not involve a video meeting, but was performed after the magistrate received the printed IVC first exam, says I am not crazy.  Accuses staff of ending this video call because patient had observed it. Explained rationale of needing an involuntary commitment and inpatient transfer to observe patient after methamphetamine use and in the setting of patient's previous bipolar disorder.  Said that she would prefer [facility], after which patient then broke down into tears.  Patient initially allowed to speak to mother only if she were present, then provided verbal permission to speak to mother when alone.   On collateral call with mother, Paz Fuentes [phone]):  Patient has been taking, on occasion, methamphetamine and THC. Sleeps a lot. Has a vape pen. Was involuntarily committed 6 weeks ago, but got to the facility before the papers got there. Was only there five days. Was much improved on return, and not as hateful, evil, irritated. When patient called mother now, patient says I heard you talking about me. Parents did not know where patient was: you know where I am. Believes patient has schizophrenia -- paranoid. Believes people are outside and looking for her. Said that the drones in the air are making her arms numb and pulled on the side of the highway. Started to walk. Left the car on the  side of the road by the keys in it. Believes paranoia exists consistently, but gets better or worse depending on methamphetamine use. Previously diagnosed Bipolar with Manic episode. Patient was adopted from [place] aft 28 years old. Always been very don't touch me, don't hug me. Rejects love and affection. No NSSI or threats of suicide. Does not take any responsibility. 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 Kind of like a hoarder. Been on Lexapro  previously. Either has medicaid or nothing. Lives with mom. Would be returning to home once stabilized. No guns at home. No large stashes of medication. Parents meds are locked. Has stolen things from parents in the past. Asked if she could go to a program for women.  No TBI, seizure or withdrawal. Agrees with IVC. Wants patient to get help. No known allergies. Some mood lability.   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  And Per chart review, patient has historical diagnoses of bipolar disorder, borderline personality disorder.  She was recently hospitalized at [facility] 6/11 - 6/19, for a manic episode.  Was started on a medication that is likely to Lamictal .  Sees [facility] outpatient-who stopped patient's Lamictal  an started Vraylar  1.5.  ADHD diagnosis, previously on Adderall. Past Medical History: Has 3 children, Family Psychiatric  History: Patient was adopted from [place] as a child Social History: Lives with parents, endorses occasional alcohol an cannabis use.    Pain  Medications: See MAR Prescriptions: See MAR Over the Counter: See MAR History of alcohol / drug use?: Yes Longest period of sobriety (when/how long): Unknown; 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 Negative  Consequences of Use:  (Denies) Withdrawal Symptoms:  (Denies)  Sleep: Fair  Appetite:  Fair  Current Medications:  Current Facility-Administered Medications  Medication Dose Route Frequency Provider Last Rate Last Admin   acetaminophen  (TYLENOL ) tablet 650 mg  650 mg Oral Q6H PRN Ajibola, Ene A, NP       alum & mag hydroxide-simeth (MAALOX/MYLANTA) 200-200-20 MG/5ML suspension 30 mL  30 mL Oral Q4H PRN Ajibola, Ene A, NP       ARIPiprazole  (ABILIFY ) tablet 5 mg  5 mg Oral Daily Ajibola, Ene A, NP   5 mg at 03/11/24 9076   haloperidol  (HALDOL ) tablet 5 mg  5 mg Oral TID PRN Ajibola, Ene A, NP   5 mg at 03/10/24 0743   And   diphenhydrAMINE  (BENADRYL ) capsule 50 mg  50 mg Oral TID PRN Ajibola, Ene A, NP   50 mg at 03/10/24 0743   haloperidol  lactate (HALDOL ) injection 5 mg  5 mg Intramuscular TID PRN Ajibola, Ene A, NP       And   diphenhydrAMINE  (BENADRYL ) injection 50 mg  50 mg Intramuscular TID PRN Ajibola, Ene A, NP       And   LORazepam  (ATIVAN ) injection 2 mg  2 mg Intramuscular TID PRN Ajibola, Ene A, NP       haloperidol  lactate (HALDOL ) injection 10 mg  10 mg Intramuscular TID PRN Ajibola, Ene A, NP       And   diphenhydrAMINE  (BENADRYL ) injection 50 mg  50 mg Intramuscular TID PRN Ajibola, Ene A, NP   50 mg at 03/11/24 1104   And   LORazepam  (ATIVAN ) injection 2 mg  2 mg Intramuscular TID PRN Ajibola, Ene A, NP       gabapentin  (NEURONTIN ) capsule 300 mg  300 mg Oral BID Ajibola, Ene A, NP   300 mg at 03/11/24 9075   hydrOXYzine  (ATARAX ) tablet 25 mg  25 mg Oral TID PRN Ajibola, Ene A, NP   25 mg at 03/11/24 1007   lamoTRIgine  (LAMICTAL ) tablet 25 mg  25 mg Oral Daily Rollene Katz, MD   25 mg at 03/10/24 1522   magnesium  hydroxide (MILK OF MAGNESIA) suspension 30 mL  30 mL Oral Daily PRN Ajibola, Ene A, NP       traZODone  (DESYREL ) tablet 50 mg  50 mg Oral QHS PRN Ajibola, Ene A, NP   50 mg at 03/10/24 2133   Current Outpatient Medications  Medication Sig Dispense  Refill   amphetamine -dextroamphetamine  (ADDERALL) 30 MG tablet Take 1 tablet by mouth 2 (two) times daily.     FLUoxetine  (PROZAC ) 20 MG capsule Take 20 mg by mouth daily.     gabapentin  (NEURONTIN ) 300 MG capsule Take 300 mg by mouth 2 (two) times daily as needed (For anxiety).     traZODone  (DESYREL ) 50 MG tablet Take 50 mg by mouth at bedtime as needed.     cariprazine  (VRAYLAR ) 1.5 MG capsule Take 1.5 mg by mouth daily. (Patient not taking: Reported on 03/10/2024)      Labs  Lab Results:  Admission on 03/09/2024  Component Date Value Ref Range Status   WBC 03/09/2024 20.1 (H)  4.0 - 10.5 K/uL Final   RBC 03/09/2024 4.82  3.87 - 5.11 MIL/uL Final  Hemoglobin 03/09/2024 14.1  12.0 - 15.0 g/dL Final   HCT 92/93/7974 41.8  36.0 - 46.0 % Final   MCV 03/09/2024 86.7  80.0 - 100.0 fL Final   MCH 03/09/2024 29.3  26.0 - 34.0 pg Final   MCHC 03/09/2024 33.7  30.0 - 36.0 g/dL Final   RDW 92/93/7974 13.0  11.5 - 15.5 % Final   Platelets 03/09/2024 418 (H)  150 - 400 K/uL Final   nRBC 03/09/2024 0.0  0.0 - 0.2 % Final   Neutrophils Relative % 03/09/2024 80  % Final   Neutro Abs 03/09/2024 16.0 (H)  1.7 - 7.7 K/uL Final   Lymphocytes Relative 03/09/2024 15  % Final   Lymphs Abs 03/09/2024 3.0  0.7 - 4.0 K/uL Final   Monocytes Relative 03/09/2024 5  % Final   Monocytes Absolute 03/09/2024 1.1 (H)  0.1 - 1.0 K/uL Final   Eosinophils Relative 03/09/2024 0  % Final   Eosinophils Absolute 03/09/2024 0.0  0.0 - 0.5 K/uL Final   Basophils Relative 03/09/2024 0  % Final   Basophils Absolute 03/09/2024 0.0  0.0 - 0.1 K/uL Final   Immature Granulocytes 03/09/2024 0  % Final   Abs Immature Granulocytes 03/09/2024 0.09 (H)  0.00 - 0.07 K/uL Final   Performed at [facility], [address] [place], [place] [address]   Sodium 03/09/2024 135  135 - 145 mmol/L Final   Potassium 03/09/2024 4.1  3.5 - 5.1 mmol/L Final   Chloride 03/09/2024 102  98 - 111 mmol/L Final   CO2 03/09/2024 19 (L)  22 - 32  mmol/L Final   Glucose, Bld 03/09/2024 75  70 - 99 mg/dL Final   Glucose reference range applies only to samples taken after fasting for at least 8 hours.   BUN 03/09/2024 10  6 - 20 mg/dL Final   Creatinine, Ser 03/09/2024 0.58  0.44 - 1.00 mg/dL Final   Calcium 92/93/7974 9.6  8.9 - 10.3 mg/dL Final   Total Protein 92/93/7974 8.4 (H)  6.5 - 8.1 g/dL Final   Albumin 92/93/7974 4.6  3.5 - 5.0 g/dL Final   AST 92/93/7974 26  15 - 41 U/L Final   ALT 03/09/2024 17  0 - 44 U/L Final   Alkaline Phosphatase 03/09/2024 77  38 - 126 U/L Final   Total Bilirubin 03/09/2024 0.9  0.0 - 1.2 mg/dL Final   GFR, Estimated 03/09/2024 >60  >60 mL/min Final   Comment: (NOTE) Calculated using the CKD-EPI Creatinine Equation (2021)    Anion gap 03/09/2024 14  5 - 15 Final   Performed at [facility], [address], [place], [place] [address]   Hgb A1c MFr Bld 03/09/2024 4.8  4.8 - 5.6 % Final   Comment: (NOTE) Diagnosis of Diabetes The following HbA1c ranges recommended by the American Diabetes Association (ADA) may be used as an aid in the diagnosis of diabetes mellitus.  Hemoglobin             Suggested A1C NGSP%              Diagnosis  <5.7                   Non Diabetic  5.7-6.4                Pre-Diabetic  >6.4                   Diabetic  <7.0  Glycemic control for                       adults with diabetes.     Mean Plasma Glucose 03/09/2024 91.06  mg/dL Final   Performed at [facility], [address] [place], [place] [address]   Alcohol, Ethyl (B) 03/09/2024 <15  <15 mg/dL Final   Comment: (NOTE) For medical purposes only. Performed at [facility], [address] [place], [place] [address]    TSH 03/09/2024 3.530  0.350 - 4.500 uIU/mL Final   Comment: Performed by a 3rd Generation assay with a functional sensitivity of <=0.01 uIU/mL. Performed at [facility], [address], [place], [place] [address]    Preg Test, Ur 03/11/2024  Negative  Negative Final   POC Amphetamine  UR 03/11/2024 None Detected  NONE DETECTED (Cut Off Level 1000 ng/mL) Final   POC Secobarbital (BAR) 03/11/2024 None Detected  NONE DETECTED (Cut Off Level 300 ng/mL) Final   POC Buprenorphine (BUP) 03/11/2024 None Detected  NONE DETECTED (Cut Off Level 10 ng/mL) Final   POC Oxazepam (BZO) 03/11/2024 None Detected  NONE DETECTED (Cut Off Level 300 ng/mL) Final   POC Cocaine UR 03/11/2024 None Detected  NONE DETECTED (Cut Off Level 300 ng/mL) Final   POC Methamphetamine UR 03/11/2024 Positive (A)  NONE DETECTED (Cut Off Level 1000 ng/mL) Final   POC Morphine  03/11/2024 None Detected  NONE DETECTED (Cut Off Level 300 ng/mL) Final   POC Methadone UR 03/11/2024 None Detected  NONE DETECTED (Cut Off Level 300 ng/mL) Final   POC Oxycodone  UR 03/11/2024 None Detected  NONE DETECTED (Cut Off Level 100 ng/mL) Final   POC Marijuana UR 03/11/2024 Positive (A)  NONE DETECTED (Cut Off Level 50 ng/mL) Final    Blood Alcohol level:  Lab Results  Component Value Date   [facility] <15 03/09/2024   ETH <10 11/17/2019    Metabolic Disorder Labs: Lab Results  Component Value Date   HGBA1C 4.8 03/09/2024   MPG 91.06 03/09/2024   No results found for: PROLACTIN No results found for: CHOL, TRIG, HDL, CHOLHDL, VLDL, LDLCALC  Therapeutic Lab Levels: No results found for: LITHIUM No results found for: VALPROATE No results found for: CBMZ  Physical Findings   GAD-7    Flowsheet Row Office Visit from 10/04/2018 in [facility] at [facility]  Total GAD-7 Score 7   PHQ2-9    Flowsheet Row Office Visit from 10/04/2018 in Primary Care at [facility] Visit from 04/23/2018 in Primary Care at [facility] Visit from 03/13/2018 in [facility] Care at [facility] Visit from 03/04/2018 in Primary Care at [facility] Visit from 04/17/2017 in Primary Care at [facility]  PHQ-2 Total Score 0 0 0 0 0   Flowsheet Row ED from 03/09/2024 in [facility] Admission (Discharged) from 10/30/2021 in [facility] 4S Mother Baby Unit Admission (Discharged) from 10/20/2021 in [facility] 1S Maternity Assessment Unit  C-SSRS RISK CATEGORY No Risk No Risk No Risk     Musculoskeletal  Strength & Muscle Tone: within normal limits Gait & Station: normal Patient leans: N/A  Psychiatric Specialty Exam  Presentation  General Appearance:  Casual; Appropriate for Environment  Eye Contact: Good  Speech: Pressured (difficult to interrupt)  Speech Volume: Normal  Handedness: Right   Mood and Affect  Mood: Euphoric; Anxious  Affect: Congruent   Thought Process  Thought Processes: Other (comment) (largely linear, but occasional tangents, word substitution  errors, rapid speech)  Descriptions of Associations:Tangential  Orientation:Full (Time, Place and Person)  Thought Content:Perseveration; Scattered; Tangential  Diagnosis of Schizophrenia or Schizoaffective disorder in past: No    Hallucinations:Hallucinations: None (Told nursing I can hear them talking about me overnight, denied now)  Ideas of Reference:None  Suicidal Thoughts:Suicidal Thoughts: No  Homicidal Thoughts:Homicidal Thoughts: No   Sensorium  Memory: Immediate Poor; Recent Fair  Judgment: Poor  Insight: Poor   Executive Functions  Concentration: Fair  Attention Span: Fair  Recall: [employer] of Knowledge: Fair  Language: Fair   Psychomotor Activity  Psychomotor Activity: Psychomotor Activity: Normal   Assets  Assets: Communication Skills; Desire for Improvement; Housing; Physical Health   Sleep  Sleep: Sleep: Poor (Needed haldol  5 mg x2 overnight for irrtitation, confusion and didn't sleep at all night before, after meth use.)  No Safety Checks orders active in given range  No data recorded  Physical Exam  Physical Exam Vitals reviewed.  Constitutional:      General: She is not in acute distress. Pulmonary:     Effort:  Pulmonary effort is normal. No respiratory distress.  Neurological:     Mental Status: She is alert.    Review of Systems  Constitutional:  Negative for chills and fever.  Gastrointestinal:  Negative for nausea and vomiting.  Psychiatric/Behavioral:  Positive for substance abuse. Negative for suicidal ideas.    Blood pressure 99/66, pulse 94, temperature 98.8 F (37.1 C), temperature source Oral, resp. rate 18, SpO2 100%, unknown if currently breastfeeding. There is no height or weight on file to calculate BMI.  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 More likely an underlying psychiatric process. Tearful this AM. Needed only hydroxyzine  overnight. Remains paranoid, believes she saw her mother on a screen in the nursing station, having a virtual meeting about her, but that staffed turned it off because she was paying attention. UDS+ for methapmphetamine.  - Patient is appropriate for inpatient hospitalization for acute stabilization of substance-induced psychosis versus manic episode.  Will be placed under involuntary commitment. - Continue Abilify  5 mg daily for now.   - Hold home Prozac  20 mg at the setting of manic versus psychotic episode.  This may not be an appropriate medication for patient with bipolar disorder diagnosis. - Continue Lamictal  25 mg daily for long-term mood stabilization.   # Leukocytosis to 20.1 - Patient denied clinical symptoms of fever.  May be associated with recent methamphetamine use. Will continue to monitor. - Ordered repeat CBC,  needs to be collected.   Aloha Bartok, MD 03/11/2024 11:08 AM

## 2024-03-12 ENCOUNTER — Inpatient Hospital Stay (HOSPITAL_COMMUNITY): Admission: AD | Admit: 2024-03-12 | Source: Intra-hospital

## 2024-03-12 MED ORDER — HYDROXYZINE HCL 25 MG PO TABS: 25.0000 mg | ORAL_TABLET | Freq: Three times a day (TID) | ORAL | 0 refills | Status: AC | PRN

## 2024-03-12 MED ORDER — FLUOXETINE HCL 20 MG PO CAPS: 20.0000 mg | ORAL_CAPSULE | Freq: Every day | ORAL | 0 refills | Status: AC

## 2024-03-12 MED ORDER — HYDROCORTISONE 1 % EX CREA
TOPICAL_CREAM | Freq: Two times a day (BID) | CUTANEOUS | Status: DC | PRN
  Filled 2024-03-12: qty 28

## 2024-03-12 MED ORDER — RISPERIDONE 1 MG PO TABS: 1.0000 mg | ORAL_TABLET | Freq: Every day | ORAL | Status: DC

## 2024-03-12 MED ORDER — CARIPRAZINE HCL 1.5 MG PO CAPS: 1.5000 mg | ORAL_CAPSULE | Freq: Every day | ORAL | 0 refills | Status: AC

## 2024-03-12 MED ORDER — TRAZODONE HCL 50 MG PO TABS: 50.0000 mg | ORAL_TABLET | Freq: Every evening | ORAL | 0 refills | Status: AC | PRN
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
## 2024-03-12 NOTE — Progress Notes (Signed)
Pt is asleep. Respiration are even and unlabored. No signs of acute distress noted. Staff will monitor for pt's safety.

## 2024-03-12 NOTE — Discharge Instructions (Addendum)
 Diana Farrell,  Below are resources that are available to [facility] residents who do not currently have insurance. I strongly recommend to follow up with a walk-in appointment at [facility] on Monday, 7/14 to get set up with psychiatric medication management. I've also added some substance use resources below.   In addition, I recommend starting an intensive outpatient substance use rehabilitation program like CD-IOP, which is an intensive outpatient drug rehab program we have here at [facility] upstairs. (Details at the bottom)  Thank you for letting me participate in your care and I wish you all of the best,  Odis Cleveland, MD [facility] Psychiatry Residency, PGY-2  Medication Management and Therapy for No Insurance/IPRS Based on observation and your report, outpatient services with psychiatry and therapy have been recommended.  It is imperative to your mental health that seek out services 7-10 day from your day of discharge to prevent another crisis. A list of referrals has been provided below based on your needs and recommendations.  You are not limited to the list provided.  In case of an urgent crisis, you may contact the Mobile Crisis Unit with [facility] at [phone].   You have the option to call 211 for assistance in finding additional mental health agencies if these do not meet your needs.       [facility] [address] SECOND FLOOR [place], [place] [address] [phone]  OUTPATIENT Walk-in information: Please note, all walk-ins are first come & first serve, with limited number of availability.  Please note that to be eligible for services you must bring: ID or a piece of mail with your name [facility] address  Therapist for therapy:  Monday & Wednesdays: Please ARRIVE at 7:15 AM for registration Will START at 8:00 AM Every 1st & 2nd Friday of the month: Please ARRIVE at 10:15 AM for registration Will START at 1 PM - 5  PM  Psychiatrist for medication management: Monday - Friday:  Please ARRIVE at 6:30 AM for registration Will START at 7:00 AM  Regretfully, due to limited availability, please be aware that you may not been seen on the same day as walk-in. Please consider making an appoint or try again. Thank you for your patience and understanding.    [facility] [address] [place], [place], [address] [phone] phone ([employer], [place]; for individuals without insurance, please call and speak to our staff)   [facility]      [address]      [place], [place] [address]      [phone]        [facility]      [address]      [place], [place], [address]      [phone] phone      [phone] fax        [facility]      [address]      [place], [place], [address]      [phone] phone to set up initial appointment       [facility]     [address]     [place], [place], [address]     [phone] phone     [phone] fax    --------------------------------------------------------------------------------------------  Based on the information you have provided and the presenting issue, outpatient services and resources for have been  recommended.  It is imperative that you follow through with treatment recommendations within 5-7 days from the of discharge to mitigate further risk to your safety and mental well-being. A list of referrals has been provided below to get you started.  You are not limited to the list provided.  In case of an urgent crisis, you may contact the Mobile Crisis Unit with [facility] at [phone].    SUBSTANCE USE TREATMENT for Medicaid/Medicare and State Funded/IPRS  [facility] [address], [place], [address] [phone] phone NOTE: [facility] is no longer offering IOP services.   Serves those who are low-income or have no insurance.  [facility] [address], [place], [place], [address] [phone] phone [phone] fax NOTE: Does have Substance Abuse-Intensive Outpatient Program [facility]) as well as transitional housing if eligible.  [facility] [address] [place], [place], [address] [phone] phone [phone] fax  [facility] [phone] [place], [place], [address] [phone] phone [phone] fax   ----------------------------------------------------------------------------------------------                   [facility]   Specialized Group Therapy for Substance Abuse If you have a substance abuse disorder (with or without a mental health condition), you may benefit from specialized substance abuse therapy in a group setting. According to discharge survey data, 70 percent of patients completing our chemical dependency intensive outpatient program report fewer symptoms of substance abuse and incidents of relapse.  Under the guidance of a licensed mental health professional, meet with your peers every Monday, Wednesday and Friday from 9 a.m. to 12 p.m. for 6 to 8 weeks to:  Learn about chemical dependency, mental illness and co-occurring disorders. Develop relapse-prevention skills. Set personalized goals with your treatment team. To build on the skills you gain, you can attend Alcoholics Anonymous or Narcotics Anonymous meetings in the evenings and access follow-up care through weekly group meetings with peers. Ongoing support promotes wellness and recovery.  For more information, call Zell Maier, LCSW at [phone]. We work directly with employers and families to ensure you receive the care you need.  Follow-up recommendations:  Activity:  Normal, as tolerated Diet:  Per PCP recommendation  Patient is instructed prior to discharge to: Take all medications as prescribed by his mental  healthcare provider. Report any adverse effects and/or reactions from the medicines to his outpatient provider promptly. Patient has been instructed & cautioned: To not engage in alcohol and or illegal drug use while on prescription medicines.  In the event of worsening symptoms, patient is instructed to call the crisis hotline at 988, 911 and or go to the nearest ED for appropriate evaluation and treatment of symptoms. To follow-up with his primary care provider for your other medical issues, concerns and or health care needs.

## 2024-03-12 NOTE — Progress Notes (Signed)
 Pt has been accepted to [facility] on 03/12/2024 Bed assignment: 505-1   Pt meets inpatient criteria per: Morene Cleveland MD  Attending Physician will be: Dr. Prentis    Report can be called to: Adult unit: [phone]  Pt can arrive after (pending items are received/pending discharges)  Care Team Notified: [facility] Cherylynn Ernst RN, Ryta Sherleen RN, Corean Potters MD, Damien Fireman RN   [place] Keeghan Bialy LCSW-A   03/12/2024 10:16 AM

## 2024-03-12 NOTE — Discharge Summary (Signed)
 Diana Farrell to be discharged Home per MD order. Discussed with the patient and all questions fully answered. An After Visit Summary was printed and given to the patient. Medication scripts were also given to patient.  Patient escorted out and discharged home via private auto.  Dorla Jung  03/12/2024 1:34 PM
